# Patient Record
Sex: Female | Born: 1980 | Race: Black or African American | Hispanic: No | Marital: Married | Smoking: Never smoker
Health system: Southern US, Community
[De-identification: ages and names within clinical notes are randomized; demographics above are authoritative.]

## PROBLEM LIST (undated history)

## (undated) DIAGNOSIS — Z9851 Tubal ligation status: Secondary | ICD-10-CM

## (undated) HISTORY — PX: APPENDECTOMY: SHX54

## (undated) HISTORY — PX: ARTHROSCOPIC REPAIR ACL: SUR80

---
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
## 2002-02-22 ENCOUNTER — Other Ambulatory Visit: Admission: RE | Admit: 2002-02-22 | Discharge: 2002-02-22 | Payer: Self-pay | Admitting: Obstetrics & Gynecology

## 2002-02-23 ENCOUNTER — Other Ambulatory Visit: Admission: RE | Admit: 2002-02-23 | Discharge: 2002-02-23 | Payer: Self-pay | Admitting: Obstetrics & Gynecology

## 2002-11-06 ENCOUNTER — Emergency Department (HOSPITAL_COMMUNITY): Admission: EM | Admit: 2002-11-06 | Discharge: 2002-11-07 | Payer: Self-pay | Admitting: Emergency Medicine

## 2002-11-07 ENCOUNTER — Encounter: Payer: Self-pay | Admitting: Emergency Medicine

## 2003-11-13 ENCOUNTER — Inpatient Hospital Stay (HOSPITAL_COMMUNITY): Admission: AD | Admit: 2003-11-13 | Discharge: 2003-11-13 | Payer: Self-pay | Admitting: Obstetrics and Gynecology

## 2004-01-29 ENCOUNTER — Emergency Department (HOSPITAL_COMMUNITY): Admission: EM | Admit: 2004-01-29 | Discharge: 2004-01-29 | Payer: Self-pay | Admitting: Emergency Medicine

## 2004-03-02 ENCOUNTER — Emergency Department (HOSPITAL_COMMUNITY): Admission: EM | Admit: 2004-03-02 | Discharge: 2004-03-03 | Payer: Self-pay | Admitting: Emergency Medicine

## 2010-04-11 ENCOUNTER — Emergency Department (HOSPITAL_COMMUNITY): Admission: EM | Admit: 2010-04-11 | Discharge: 2010-04-11 | Payer: Self-pay | Admitting: Emergency Medicine

## 2010-04-12 ENCOUNTER — Emergency Department (HOSPITAL_COMMUNITY): Admission: EM | Admit: 2010-04-12 | Discharge: 2010-04-12 | Payer: Self-pay | Admitting: Emergency Medicine

## 2010-10-15 LAB — URINALYSIS, ROUTINE W REFLEX MICROSCOPIC
Bilirubin Urine: NEGATIVE
Glucose, UA: NEGATIVE mg/dL
Hgb urine dipstick: NEGATIVE
Ketones, ur: 15 mg/dL — AB
Nitrite: NEGATIVE
Protein, ur: NEGATIVE mg/dL
Specific Gravity, Urine: 1.023 (ref 1.005–1.030)
Urobilinogen, UA: 1 mg/dL (ref 0.0–1.0)
pH: 7.5 (ref 5.0–8.0)

## 2010-10-15 LAB — POCT PREGNANCY, URINE: Preg Test, Ur: NEGATIVE

## 2010-10-15 LAB — URINE MICROSCOPIC-ADD ON

## 2010-10-15 LAB — URINE CULTURE: Culture: NO GROWTH

## 2010-12-18 NOTE — Consult Note (Signed)
[place]  Patient:    Tara Willis, Tara Willis                          MRN: [id number] Proc. Date: 07/05/00 Adm. Date:  [id number] Disc. Date: [id number] Attending:  Doug Sou CC:         Wynona Meals, M.D.   Consultation Report  REASON FOR CONSULT:  Evaluate patient with neck trauma and hoarseness.  HISTORY OF PRESENT ILLNESS:  Tara Willis is a 30 year old black female who was playing basketball earlier tonight with her school team and had an elbow to her neck with resultant hoarseness and loss of voice.  She presents to the emergency room with neck trauma and loss of voice.  PHYSICAL EXAMINATION:  On examination the patient has slightly weak voice but is able to vocalize reasonably well.  She has no airway problems.  She has some slight soreness on swallowing, but again no airway problems.  Palpation exam of the neck reveals no ecchymosis, no swelling.  Fiberoptic laryngoscopy was performed to evaluate the upper airway and larynx.  Base of tongue, epiglottis was normal.  There was no epiglottic swelling.  Vocal cords appeared clear with no significant vocal cord swelling, no ecchymosis on the vocal cords.  Vocal cords had symmetric mobility.  Glottis was clear. Subglottic area likewise was clear.  IMPRESSION:  Blunt laryngeal trauma with normal vocal cord, normal airway exam by laryngoscopy.  RECOMMENDATION:  Recommended voice rest x 24 hours.  Will follow with Dr. Ezzard Standing.  If the patient has any persistent voice problems, patient given the number to the office [phone] for follow-up as needed. DD:  07/05/00 TD:  07/06/00 Job: [id number]

## 2011-04-16 ENCOUNTER — Emergency Department (HOSPITAL_COMMUNITY)
Admission: EM | Admit: 2011-04-16 | Discharge: 2011-04-17 | Disposition: A | Discharge status: Home / Self Care | Attending: Emergency Medicine | Admitting: Emergency Medicine

## 2011-04-16 DIAGNOSIS — K5900 Constipation, unspecified: Secondary | ICD-10-CM | POA: Insufficient documentation

## 2011-04-16 DIAGNOSIS — Z9889 Other specified postprocedural states: Secondary | ICD-10-CM | POA: Insufficient documentation

## 2011-04-16 DIAGNOSIS — Z79899 Other long term (current) drug therapy: Secondary | ICD-10-CM | POA: Insufficient documentation

## 2011-04-17 ENCOUNTER — Emergency Department (HOSPITAL_COMMUNITY)

## 2015-08-03 NOTE — L&D Delivery Note (Signed)
Delivery Note At 8:47 PM a viable and healthy female was delivered via Vaginal, Spontaneous Delivery (Presentation: Left Occiput Anterior).  APGAR: 8, 9; weight pending .   Placenta status: Intact, Spontaneous Pathology due to prematurity.  Cord: CAN x1, not reducible, clamped and cut. 3 vessels with the following complications: None Short.  Cord pH: none  Anesthesia: Epidural  Episiotomy: None Lacerations: None Suture Repair: n/a Est. Blood Loss (mL): 200  Mom to postpartum.  Baby to Couplet care / Skin to Skin.  Afnan Cadiente A 10/20/2015, 10:19 PM

## 2015-10-20 ENCOUNTER — Inpatient Hospital Stay (HOSPITAL_COMMUNITY): Admitting: Anesthesiology

## 2015-10-20 ENCOUNTER — Encounter (HOSPITAL_COMMUNITY): Payer: Self-pay

## 2015-10-20 ENCOUNTER — Inpatient Hospital Stay (HOSPITAL_COMMUNITY)
Admission: AD | Admit: 2015-10-20 | Discharge: 2015-10-22 | DRG: 767 | Disposition: A | Discharge status: Home / Self Care | Source: Ambulatory Visit | Attending: Obstetrics and Gynecology | Admitting: Obstetrics and Gynecology

## 2015-10-20 DIAGNOSIS — Z302 Encounter for sterilization: Secondary | ICD-10-CM | POA: Diagnosis not present

## 2015-10-20 DIAGNOSIS — Z3A35 35 weeks gestation of pregnancy: Secondary | ICD-10-CM | POA: Diagnosis not present

## 2015-10-20 DIAGNOSIS — Z9851 Tubal ligation status: Secondary | ICD-10-CM

## 2015-10-20 HISTORY — DX: Tubal ligation status: Z98.51

## 2015-10-20 LAB — CBC
HCT: 33.5 % — ABNORMAL LOW (ref 36.0–46.0)
Hemoglobin: 11.1 g/dL — ABNORMAL LOW (ref 12.0–15.0)
MCH: 26.1 pg (ref 26.0–34.0)
MCHC: 33.1 g/dL (ref 30.0–36.0)
MCV: 78.6 fL (ref 78.0–100.0)
Platelets: 168 10*3/uL (ref 150–400)
RBC: 4.26 MIL/uL (ref 3.87–5.11)
RDW: 15 % (ref 11.5–15.5)
WBC: 11.2 10*3/uL — ABNORMAL HIGH (ref 4.0–10.5)

## 2015-10-20 LAB — TYPE AND SCREEN
ABO/RH(D): O POS
Antibody Screen: NEGATIVE
DAT, IgG: NEGATIVE

## 2015-10-20 LAB — OB RESULTS CONSOLE GC/CHLAMYDIA
Chlamydia: NEGATIVE
Gonorrhea: NEGATIVE

## 2015-10-20 LAB — OB RESULTS CONSOLE HEPATITIS B SURFACE ANTIGEN: HEP B S AG: NEGATIVE

## 2015-10-20 LAB — OB RESULTS CONSOLE RPR: RPR: NONREACTIVE

## 2015-10-20 LAB — ABO/RH: ABO/RH(D): O POS

## 2015-10-20 LAB — OB RESULTS CONSOLE HIV ANTIBODY (ROUTINE TESTING): HIV: NONREACTIVE

## 2015-10-20 LAB — OB RESULTS CONSOLE ANTIBODY SCREEN: Antibody Screen: NEGATIVE

## 2015-10-20 LAB — OB RESULTS CONSOLE ABO/RH: RH TYPE: POSITIVE

## 2015-10-20 LAB — OB RESULTS CONSOLE RUBELLA ANTIBODY, IGM: RUBELLA: IMMUNE

## 2015-10-20 MED ORDER — OXYTOCIN BOLUS FROM INFUSION: 500.0000 mL | INTRAVENOUS | Status: DC

## 2015-10-20 MED ORDER — PRENATAL MULTIVITAMIN CH
1.0000 | ORAL_TABLET | Freq: Every day | ORAL | Status: DC
  Administered 2015-10-22: 1 via ORAL
  Filled 2015-10-20: qty 1

## 2015-10-20 MED ORDER — SENNOSIDES-DOCUSATE SODIUM 8.6-50 MG PO TABS
2.0000 | ORAL_TABLET | ORAL | Status: DC
  Administered 2015-10-21 (×2): 2 via ORAL
  Filled 2015-10-20: qty 2

## 2015-10-20 MED ORDER — ZOLPIDEM TARTRATE 5 MG PO TABS: 5.0000 mg | ORAL_TABLET | Freq: Every evening | ORAL | Status: DC | PRN

## 2015-10-20 MED ORDER — LIDOCAINE HCL (PF) 1 % IJ SOLN
INTRAMUSCULAR | Status: DC | PRN
  Administered 2015-10-20 (×2): 4 mL via EPIDURAL

## 2015-10-20 MED ORDER — LIDOCAINE HCL (PF) 1 % IJ SOLN
30.0000 mL | INTRAMUSCULAR | Status: DC | PRN
  Filled 2015-10-20: qty 30

## 2015-10-20 MED ORDER — FENTANYL 2.5 MCG/ML BUPIVACAINE 1/10 % EPIDURAL INFUSION (WH - ANES)
14.0000 mL/h | INTRAMUSCULAR | Status: DC | PRN
  Administered 2015-10-20: 14 mL/h via EPIDURAL
  Filled 2015-10-20: qty 125

## 2015-10-20 MED ORDER — NALBUPHINE HCL 10 MG/ML IJ SOLN
10.0000 mg | Freq: Once | INTRAMUSCULAR | Status: DC
  Filled 2015-10-20: qty 1

## 2015-10-20 MED ORDER — DIBUCAINE 1 % RE OINT: 1.0000 "application " | TOPICAL_OINTMENT | RECTAL | Status: DC | PRN

## 2015-10-20 MED ORDER — EPHEDRINE 5 MG/ML INJ
10.0000 mg | INTRAVENOUS | Status: DC | PRN
  Filled 2015-10-20: qty 2

## 2015-10-20 MED ORDER — DEXTROSE IN LACTATED RINGERS 5 % IV SOLN
INTRAVENOUS | Status: DC
  Administered 2015-10-21: 05:00:00 via INTRAVENOUS

## 2015-10-20 MED ORDER — CITRIC ACID-SODIUM CITRATE 334-500 MG/5ML PO SOLN: 30.0000 mL | ORAL | Status: DC | PRN

## 2015-10-20 MED ORDER — ACETAMINOPHEN 325 MG PO TABS: 650.0000 mg | ORAL_TABLET | ORAL | Status: DC | PRN

## 2015-10-20 MED ORDER — SODIUM CHLORIDE 0.9 % IV SOLN
2.0000 g | INTRAVENOUS | Status: AC
  Administered 2015-10-20: 2 g via INTRAVENOUS
  Filled 2015-10-20: qty 2000

## 2015-10-20 MED ORDER — OXYCODONE-ACETAMINOPHEN 5-325 MG PO TABS
1.0000 | ORAL_TABLET | ORAL | Status: DC | PRN
  Administered 2015-10-21 – 2015-10-22 (×5): 1 via ORAL
  Filled 2015-10-20 (×7): qty 1

## 2015-10-20 MED ORDER — ONDANSETRON HCL 4 MG PO TABS: 4.0000 mg | ORAL_TABLET | ORAL | Status: DC | PRN

## 2015-10-20 MED ORDER — OXYCODONE-ACETAMINOPHEN 5-325 MG PO TABS
2.0000 | ORAL_TABLET | ORAL | Status: DC | PRN
  Administered 2015-10-21 – 2015-10-22 (×2): 2 via ORAL
  Filled 2015-10-20: qty 2

## 2015-10-20 MED ORDER — LACTATED RINGERS IV SOLN
INTRAVENOUS | Status: DC
  Administered 2015-10-20: 20:00:00 via INTRAVENOUS

## 2015-10-20 MED ORDER — SIMETHICONE 80 MG PO CHEW: 80.0000 mg | CHEWABLE_TABLET | ORAL | Status: DC | PRN

## 2015-10-20 MED ORDER — LACTATED RINGERS IV SOLN: 500.0000 mL | Freq: Once | INTRAVENOUS | Status: DC

## 2015-10-20 MED ORDER — WITCH HAZEL-GLYCERIN EX PADS: 1.0000 "application " | MEDICATED_PAD | CUTANEOUS | Status: DC | PRN

## 2015-10-20 MED ORDER — DIPHENHYDRAMINE HCL 50 MG/ML IJ SOLN: 12.5000 mg | INTRAMUSCULAR | Status: DC | PRN

## 2015-10-20 MED ORDER — DIPHENHYDRAMINE HCL 25 MG PO CAPS: 25.0000 mg | ORAL_CAPSULE | Freq: Four times a day (QID) | ORAL | Status: DC | PRN

## 2015-10-20 MED ORDER — LACTATED RINGERS IV SOLN
500.0000 mL | INTRAVENOUS | Status: DC | PRN
  Administered 2015-10-20: 500 mL via INTRAVENOUS

## 2015-10-20 MED ORDER — IBUPROFEN 600 MG PO TABS
600.0000 mg | ORAL_TABLET | Freq: Four times a day (QID) | ORAL | Status: DC
  Administered 2015-10-21 – 2015-10-22 (×7): 600 mg via ORAL
  Filled 2015-10-20 (×8): qty 1

## 2015-10-20 MED ORDER — PHENYLEPHRINE 40 MCG/ML (10ML) SYRINGE FOR IV PUSH (FOR BLOOD PRESSURE SUPPORT)
80.0000 ug | PREFILLED_SYRINGE | INTRAVENOUS | Status: DC | PRN
  Filled 2015-10-20: qty 2
  Filled 2015-10-20: qty 20

## 2015-10-20 MED ORDER — BENZOCAINE-MENTHOL 20-0.5 % EX AERO
1.0000 "application " | INHALATION_SPRAY | CUTANEOUS | Status: DC | PRN
  Administered 2015-10-21: 1 via TOPICAL
  Filled 2015-10-20: qty 56

## 2015-10-20 MED ORDER — EPHEDRINE 5 MG/ML INJ: 10.0000 mg | INTRAVENOUS | Status: DC | PRN

## 2015-10-20 MED ORDER — LANOLIN HYDROUS EX OINT: TOPICAL_OINTMENT | CUTANEOUS | Status: DC | PRN

## 2015-10-20 MED ORDER — FERROUS SULFATE 325 (65 FE) MG PO TABS
325.0000 mg | ORAL_TABLET | Freq: Two times a day (BID) | ORAL | Status: DC
  Administered 2015-10-21 – 2015-10-22 (×3): 325 mg via ORAL
  Filled 2015-10-20 (×3): qty 1

## 2015-10-20 MED ORDER — ONDANSETRON HCL 4 MG/2ML IJ SOLN: 4.0000 mg | INTRAMUSCULAR | Status: DC | PRN

## 2015-10-20 MED ORDER — PHENYLEPHRINE 40 MCG/ML (10ML) SYRINGE FOR IV PUSH (FOR BLOOD PRESSURE SUPPORT)
80.0000 ug | PREFILLED_SYRINGE | INTRAVENOUS | Status: DC | PRN
  Filled 2015-10-20: qty 2

## 2015-10-20 MED ORDER — OXYTOCIN 10 UNIT/ML IJ SOLN
2.5000 [IU]/h | INTRAVENOUS | Status: DC
  Administered 2015-10-20: 39.96 [IU]/h via INTRAVENOUS
  Filled 2015-10-20: qty 10

## 2015-10-20 MED ORDER — PHENYLEPHRINE 40 MCG/ML (10ML) SYRINGE FOR IV PUSH (FOR BLOOD PRESSURE SUPPORT): 80.0000 ug | PREFILLED_SYRINGE | INTRAVENOUS | Status: DC | PRN

## 2015-10-20 NOTE — Anesthesia Preprocedure Evaluation (Signed)
Anesthesia Evaluation  Patient identified by MRN, date of birth, ID band Patient awake    Reviewed: Allergy & Precautions, NPO status , Patient's Chart, lab work & pertinent test results  Airway Mallampati: I  TM Distance: >3 FB Neck ROM: Full    Dental  (+) Teeth Intact   Pulmonary neg pulmonary ROS,    breath sounds clear to auscultation       Cardiovascular negative cardio ROS   Rhythm:Regular Rate:Normal     Neuro/Psych negative neurological ROS  negative psych ROS   GI/Hepatic negative GI ROS, Neg liver ROS,   Endo/Other  negative endocrine ROS  Renal/GU negative Renal ROS  negative genitourinary   Musculoskeletal negative musculoskeletal ROS (+)   Abdominal   Peds negative pediatric ROS (+)  Hematology negative hematology ROS (+)   Anesthesia Other Findings   Reproductive/Obstetrics (+) Pregnancy                             Lab Results  Component Value Date   WBC 11.2* 10/20/2015   HGB 11.1* 10/20/2015   HCT 33.5* 10/20/2015   MCV 78.6 10/20/2015   PLT 168 10/20/2015   No results found for: INR, PROTIME   Anesthesia Physical Anesthesia Plan  ASA: II  Anesthesia Plan: Epidural   Post-op Pain Management:    Induction:   Airway Management Planned:   Additional Equipment:   Intra-op Plan:   Post-operative Plan:   Informed Consent: I have reviewed the patients History and Physical, chart, labs and discussed the procedure including the risks, benefits and alternatives for the proposed anesthesia with the patient or authorized representative who has indicated his/her understanding and acceptance.     Plan Discussed with:   Anesthesia Plan Comments:         Anesthesia Quick Evaluation

## 2015-10-20 NOTE — Progress Notes (Addendum)
S: comfortable O: Epidural VE fully/BBOW/-3 Tracing; baseline 130  Ctx q 2-4 mins  IMP: complete IUP @ 35 6/7 weeks Unknown GBS on IV amp P) left exaggerated sims position. Defer amniotomy due to fetal position. Pitocin augmentation prn

## 2015-10-20 NOTE — Anesthesia Procedure Notes (Signed)
Epidural Patient location during procedure: OB Start time: 10/20/2015 5:35 PM End time: 10/20/2015 5:42 PM  Staffing Anesthesiologist: Shona SimpsonHOLLIS, Tara Cotta D Performed by: anesthesiologist   Preanesthetic Checklist Completed: patient identified, site marked, surgical consent, pre-op evaluation, timeout performed, IV checked, risks and benefits discussed and monitors and equipment checked  Epidural Patient position: sitting Prep: ChloraPrep Patient monitoring: heart rate, continuous pulse ox and blood pressure Approach: midline Location: L3-L4 Injection technique: LOR saline  Needle:  Needle type: Tuohy  Needle gauge: 17 G Needle length: 9 cm Catheter type: closed end flexible Catheter size: 20 Guage Test dose: negative and 1.5% lidocaine  Assessment Events: blood not aspirated, injection not painful, no injection resistance and no paresthesia  Additional Notes LOR @ 4.5  Patient identified. Risks/Benefits/Options discussed with patient including but not limited to bleeding, infection, nerve damage, paralysis, failed block, incomplete pain control, headache, blood pressure changes, nausea, vomiting, reactions to medications, itching and postpartum back pain. Confirmed with bedside nurse the patient's most recent platelet count. Confirmed with patient that they are not currently taking any anticoagulation, have any bleeding history or any family history of bleeding disorders. Patient expressed understanding and wished to proceed. All questions were answered. Sterile technique was used throughout the entire procedure. Please see nursing notes for vital signs. Test dose was given through epidural catheter and negative prior to continuing to dose epidural or start infusion. Warning signs of high block given to the patient including shortness of breath, tingling/numbness in hands, complete motor block, or any concerning symptoms with instructions to call for help. Patient was given instructions on  fall risk and not to get out of bed. All questions and concerns addressed with instructions to call with any issues or inadequate analgesia.    Reason for block:procedure for pain

## 2015-10-20 NOTE — Progress Notes (Signed)
Requests permanent sterilization.  Procedure explained.  risk reviewed including failure rate 1/300, nonreversible, permanent, infection, bleeding.  Desires to proceed. Consent signed. sched for 3/21

## 2015-10-20 NOTE — H&P (Signed)
OB ADMISSION/ HISTORY & PHYSICAL:  Admission Date: 10/20/2015  4:28 PM  Admit Diagnosis: labor  Tara Willis is a 35 y.o. female presenting for onset of labor  Prenatal History: EDC : 11/18/2015  [id number] with two PTB [facility]  Primary Ob Provider: Dr Cher[facility]ins [facility] Prenatal course complicated by IDA of pregnancy / hx PTB / hx PEC  Prenatal Labs: ABO, Rh:  O positive Antibody:  negative Rubella:   Immune RPR:   NR HBsAg:   Negative HIV:   NR GTT: NL GBS:   not done yet  Medical / Surgical History :  Past medical history: No past medical history on file.   Past surgical history: No past surgical history on file.  Family History: No family history on file.   Social History:  has no tobacco, alcohol, and drug history on file.  Allergies: NKDA  Review of Systems: Active FM onset of ctx @ 3pm currently every 3 minutes No LOF bloody show present  Physical Exam:  VS: Blood pressure 110/73, pulse 73, resp. rate 18.  General: alert and oriented, appears uncomfortable - requesting pain medication and epidural now Heart: RRR Lungs: Clear lung fields Abdomen: Gravid, soft and non-tender, non-distended / uterus: gravid Extremities: no edema  VE:  7cm / 90% / vtx / BBOW by nurse  FHR: baseline rate 130 / variability moderaet / accelerations + / no decelerations TOCO: ctx Q 3 minutes  Assessment: 36+ weeks gestation active stage of labor FHR category 1   Plan:  Admit Ampicillin prophylaxis for unknown GBS Nubain for pain awaiting epidural CNM to monitor until MD available from office Dr Cherly Hensenousins notified of admission    Marlinda MikeBAILEY, TANYA CNM, MSN, [facility] 10/20/2015, 4:57 PM

## 2015-10-21 ENCOUNTER — Inpatient Hospital Stay (HOSPITAL_COMMUNITY): Admitting: Anesthesiology

## 2015-10-21 ENCOUNTER — Encounter (HOSPITAL_COMMUNITY): Payer: Self-pay | Admitting: *Deleted

## 2015-10-21 ENCOUNTER — Encounter (HOSPITAL_COMMUNITY)
Admission: AD | Disposition: A | Discharge status: Home / Self Care | Payer: Self-pay | Source: Ambulatory Visit | Attending: Obstetrics and Gynecology

## 2015-10-21 DIAGNOSIS — Z9851 Tubal ligation status: Secondary | ICD-10-CM

## 2015-10-21 HISTORY — PX: TUBAL LIGATION: SHX77

## 2015-10-21 HISTORY — DX: Tubal ligation status: Z98.51

## 2015-10-21 LAB — CBC
HEMATOCRIT: 30.5 % — AB (ref 36.0–46.0)
Hemoglobin: 10 g/dL — ABNORMAL LOW (ref 12.0–15.0)
MCH: 25.7 pg — AB (ref 26.0–34.0)
MCHC: 32.8 g/dL (ref 30.0–36.0)
MCV: 78.4 fL (ref 78.0–100.0)
PLATELETS: 129 10*3/uL — AB (ref 150–400)
RBC: 3.89 MIL/uL (ref 3.87–5.11)
RDW: 15.1 % (ref 11.5–15.5)
WBC: 14.6 10*3/uL — ABNORMAL HIGH (ref 4.0–10.5)

## 2015-10-21 LAB — RPR: RPR Ser Ql: NONREACTIVE

## 2015-10-21 SURGERY — LIGATION, FALLOPIAN TUBE, POSTPARTUM
Anesthesia: Epidural | Site: Abdomen | Laterality: Bilateral

## 2015-10-21 MED ORDER — SODIUM BICARBONATE 8.4 % IV SOLN
INTRAVENOUS | Status: DC | PRN
  Administered 2015-10-21 (×5): 2 mL via EPIDURAL
  Administered 2015-10-21 (×2): 5 mL via EPIDURAL

## 2015-10-21 MED ORDER — PNEUMOCOCCAL VAC POLYVALENT 25 MCG/0.5ML IJ INJ
0.5000 mL | INJECTION | INTRAMUSCULAR | Status: DC
  Filled 2015-10-21: qty 0.5

## 2015-10-21 MED ORDER — FAMOTIDINE 20 MG PO TABS
40.0000 mg | ORAL_TABLET | Freq: Once | ORAL | Status: AC
  Administered 2015-10-21: 40 mg via ORAL
  Filled 2015-10-21: qty 2

## 2015-10-21 MED ORDER — FENTANYL CITRATE (PF) 100 MCG/2ML IJ SOLN: 25.0000 ug | INTRAMUSCULAR | Status: DC | PRN

## 2015-10-21 MED ORDER — BUPIVACAINE HCL (PF) 0.25 % IJ SOLN
INTRAMUSCULAR | Status: DC | PRN
  Administered 2015-10-21: 5 mL

## 2015-10-21 MED ORDER — ONDANSETRON HCL 4 MG/2ML IJ SOLN
INTRAMUSCULAR | Status: DC | PRN
  Administered 2015-10-21: 4 mg via INTRAVENOUS

## 2015-10-21 MED ORDER — LACTATED RINGERS IV SOLN
INTRAVENOUS | Status: DC
Start: 2015-10-21 — End: 2015-10-21
  Administered 2015-10-21: 10 mL/h via INTRAVENOUS

## 2015-10-21 MED ORDER — MIDAZOLAM HCL 5 MG/5ML IJ SOLN
INTRAMUSCULAR | Status: DC | PRN
  Administered 2015-10-21 (×2): 0.5 mg via INTRAVENOUS
  Administered 2015-10-21: 1 mg via INTRAVENOUS

## 2015-10-21 MED ORDER — LACTATED RINGERS IV SOLN
INTRAVENOUS | Status: DC | PRN
  Administered 2015-10-21 (×2): via INTRAVENOUS

## 2015-10-21 MED ORDER — METOCLOPRAMIDE HCL 10 MG PO TABS
10.0000 mg | ORAL_TABLET | Freq: Once | ORAL | Status: AC
  Administered 2015-10-21: 10 mg via ORAL
  Filled 2015-10-21: qty 1

## 2015-10-21 SURGICAL SUPPLY — 28 items
CATH ROBINSON RED A/P 16FR (CATHETERS) IMPLANT
CHLORAPREP W/TINT 26ML (MISCELLANEOUS) ×3 IMPLANT
CLOSURE WOUND 1/4 X3 (GAUZE/BANDAGES/DRESSINGS) ×1
CLOTH BEACON ORANGE TIMEOUT ST (SAFETY) ×3 IMPLANT
CONTAINER PREFILL 10% NBF 15ML (MISCELLANEOUS) ×6 IMPLANT
DRSG OPSITE POSTOP 3X4 (GAUZE/BANDAGES/DRESSINGS) ×3 IMPLANT
DRSG OPSITE POSTOP 4X10 (GAUZE/BANDAGES/DRESSINGS) ×3 IMPLANT
ELECT REM PT RETURN 9FT ADLT (ELECTROSURGICAL) ×3
ELECTRODE REM PT RTRN 9FT ADLT (ELECTROSURGICAL) ×1 IMPLANT
GLOVE BIOGEL PI IND STRL 7.0 (GLOVE) ×3 IMPLANT
GLOVE BIOGEL PI INDICATOR 7.0 (GLOVE) ×6
GLOVE ECLIPSE 6.5 STRL STRAW (GLOVE) ×3 IMPLANT
GOWN STRL REUS W/TWL LRG LVL3 (GOWN DISPOSABLE) ×6 IMPLANT
NEEDLE HYPO 22GX1.5 SAFETY (NEEDLE) ×3 IMPLANT
NS IRRIG 1000ML POUR BTL (IV SOLUTION) ×3 IMPLANT
PACK ABDOMINAL MINOR (CUSTOM PROCEDURE TRAY) ×3 IMPLANT
PENCIL BUTTON HOLSTER BLD 10FT (ELECTRODE) ×3 IMPLANT
SLEEVE SCD COMPRESS KNEE MED (MISCELLANEOUS) ×3 IMPLANT
SPONGE LAP 4X18 X RAY DECT (DISPOSABLE) IMPLANT
STRIP CLOSURE SKIN 1/4X3 (GAUZE/BANDAGES/DRESSINGS) ×2 IMPLANT
SUT CHROMIC GUT AB #0 18 (SUTURE) ×3 IMPLANT
SUT VIC AB 0 CT1 27 (SUTURE) ×2
SUT VIC AB 0 CT1 27XBRD ANBCTR (SUTURE) ×1 IMPLANT
SUT VICRYL 4-0 PS2 18IN ABS (SUTURE) ×3 IMPLANT
SYR CONTROL 10ML LL (SYRINGE) ×3 IMPLANT
TOWEL OR 17X24 6PK STRL BLUE (TOWEL DISPOSABLE) ×6 IMPLANT
TRAY FOLEY CATH SILVER 14FR (SET/KITS/TRAYS/PACK) ×3 IMPLANT
WATER STERILE IRR 1000ML POUR (IV SOLUTION) ×3 IMPLANT

## 2015-10-21 NOTE — Anesthesia Preprocedure Evaluation (Signed)
Anesthesia Evaluation  Patient identified by MRN, date of birth, ID band Patient awake    Reviewed: Allergy & Precautions, H&P , NPO status , Patient's Chart, lab work & pertinent test results  Airway Mallampati: I  TM Distance: >3 FB Neck ROM: Full    Dental no notable dental hx. (+) Teeth Intact   Pulmonary neg pulmonary ROS,    Pulmonary exam normal breath sounds clear to auscultation       Cardiovascular Exercise Tolerance: Good negative cardio ROS   Rhythm:Regular Rate:Normal     Neuro/Psych negative neurological ROS  negative psych ROS   GI/Hepatic negative GI ROS, Neg liver ROS,   Endo/Other  negative endocrine ROS  Renal/GU negative Renal ROS  negative genitourinary   Musculoskeletal negative musculoskeletal ROS (+)   Abdominal   Peds negative pediatric ROS (+)  Hematology negative hematology ROS (+)   Anesthesia Other Findings   Reproductive/Obstetrics (+) Pregnancy                             Lab Results  Component Value Date   WBC 14.6* 10/21/2015   HGB 10.0* 10/21/2015   HCT 30.5* 10/21/2015   MCV 78.4 10/21/2015   PLT 129* 10/21/2015   No results found for: INR, PROTIME   Anesthesia Physical  Anesthesia Plan  ASA: II  Anesthesia Plan: Epidural   Post-op Pain Management:    Induction:   Airway Management Planned:   Additional Equipment:   Intra-op Plan:   Post-operative Plan:   Informed Consent: I have reviewed the patients History and Physical, chart, labs and discussed the procedure including the risks, benefits and alternatives for the proposed anesthesia with the patient or authorized representative who has indicated his/her understanding and acceptance.     Plan Discussed with:   Anesthesia Plan Comments:         Anesthesia Quick Evaluation

## 2015-10-21 NOTE — Anesthesia Postprocedure Evaluation (Signed)
Anesthesia Post Note  Patient: Tara Willis  Procedure(s) Performed: * No procedures listed *  Patient location during evaluation: Mother Baby Anesthesia Type: Epidural Level of consciousness: awake and alert Pain management: pain level controlled Vital Signs Assessment: post-procedure vital signs reviewed and stable Respiratory status: spontaneous breathing Cardiovascular status: stable Postop Assessment: no headache, no backache, epidural receding and patient able to bend at knees Anesthetic complications: no    Last Vitals:  Filed Vitals:   10/21/15 0001 10/21/15 0400  BP: 111/80 123/81  Pulse: 70 65  Temp: 36.4 C 37 C  Resp: 18 18    Last Pain:  Filed Vitals:   10/21/15 0423  PainSc: 0-No pain                 Edison PaceWILKERSON,Sabino Denning

## 2015-10-21 NOTE — Transfer of Care (Signed)
Immediate Anesthesia Transfer of Care Note  Patient: Tara Willis  Procedure(s) Performed: Procedure(s): POST PARTUM TUBAL LIGATION Modified Pomeroy  (Bilateral)  Patient Location: PACU  Anesthesia Type:Epidural  Level of Consciousness: awake  Airway & Oxygen Therapy: Patient Spontanous Breathing  Post-op Assessment: Report given to RN and Post -op Vital signs reviewed and stable  Post vital signs: stable  Last Vitals:  Filed Vitals:   10/21/15 1323 10/21/15 1324  BP:    Pulse: 74 71  Temp:    Resp:      Complications: No apparent anesthesia complications

## 2015-10-21 NOTE — Anesthesia Postprocedure Evaluation (Signed)
Anesthesia Post Note  Patient: Diella N Boliver  Procedure(s) Performed: Procedure(s) (LRB): POST PARTUM TUBAL LIGATION Modified Pomeroy  (Bilateral)  Patient location during evaluation: PACU Anesthesia Type: General Level of consciousness: sedated Pain management: satisfactory to patient Vital Signs Assessment: post-procedure vital signs reviewed and stable Respiratory status: spontaneous breathing Cardiovascular status: stable Anesthetic complications: no    Last Vitals:  Filed Vitals:   10/21/15 1515 10/21/15 1543  BP:    Pulse: 69 76  Temp:  36.8 C  Resp: 15     Last Pain:  Filed Vitals:   10/21/15 1545  PainSc: 2     LLE Motor Response: Purposeful movement (rocks hips) (10/21/15 1543) LLE Sensation: Decreased;Tingling (10/21/15 1543) RLE Motor Response: Purposeful movement (10/21/15 1543) RLE Sensation: Decreased;Tingling (10/21/15 1543)      Jiles GarterJACKSON,Elizah Lydon EDWARD

## 2015-10-21 NOTE — Brief Op Note (Signed)
10/20/2015 - 10/21/2015  2:41 PM  PATIENT:  Tara Willis  35 y.o. female  PRE-OPERATIVE DIAGNOSIS:  Desires Sterilization  POST-OPERATIVE DIAGNOSIS:  Desires Sterilization  PROCEDURE:  Procedure(s): POST PARTUM TUBAL LIGATION Modified Pomeroy  (Bilateral)  SURGEON:  Surgeon(s) and Role:    * IT trainerheronette Aadith Raudenbush, MD - Primary  PHYSICIAN ASSISTANT:   ASSISTANTS: none   ANESTHESIA:   epidural Findings: nl tubes and ovaries, uterus 1-2 FB above umb.  umb hernia EBL:  Total I/O In: 1908.3 [I.V.:1908.3] Out: 330 [Urine:325; Blood:5]  BLOOD ADMINISTERED:none  DRAINS: none   LOCAL MEDICATIONS USED:  MARCAINE     SPECIMEN:  Source of Specimen:  portion of right and left tube  DISPOSITION OF SPECIMEN:  PATHOLOGY  COUNTS:  YES  TOURNIQUET:  * No tourniquets in log *  DICTATION: .Other Dictation: Dictation Number [id number]  PLAN OF CARE: Admit to inpatient   PATIENT DISPOSITION:  PACU - hemodynamically stable.   Delay start of Pharmacological VTE agent (>24hrs) due to surgical blood loss or risk of bleeding: no

## 2015-10-21 NOTE — Progress Notes (Signed)
Patient ID: Tara Willis, female   DOB: 07/05/1981, 35 y.o.   MRN: [id number] PPD # 1 SVD  S:  Reports feeling well.             Tolerating po/ No nausea or vomiting             Bleeding is light             Pain controlled with ibuprofen (OTC)             Up ad lib / ambulatory / voiding without difficulties    Newborn  Information for the patient's newborn:  [name], Girl [name] [[id number]][[id number]]  female  bottle feeding     O:  A & O x 3, in no apparent distress              VS:  Filed Vitals:   10/20/15 2201 10/20/15 2305 10/21/15 0001 10/21/15 0400  BP: 118/84 113/69 111/80 123/81  Pulse: 71 69 70 65  Temp:  97.6 F (36.4 C) 97.5 F (36.4 C) 98.6 F (37 C)  TempSrc:  Oral Oral Oral  Resp: 18 18 18 18   Height:      Weight:        LABS:  Recent Labs  10/20/15 1655 10/21/15 0753  WBC 11.2* 14.6*  HGB 11.1* 10.0*  HCT 33.5* 30.5*  PLT 168 129*    Blood type: O/Positive/-- (03/20 1724)  Rubella: Immune (03/20 1724)   I&O: I/O last 3 completed shifts: In: -  Out: 250 [Urine:50; Blood:200]             Lungs: Clear and unlabored  Heart: regular rate and rhythm / no murmurs  Abdomen: soft, non-tender, non-distended             Fundus: firm, non-tender, U-1  Perineum: intact, no edema  Lochia: minimal  Extremities: No edema, no calf pain or tenderness, No Homans    A/P: PPD # 1  35 y.o., [id number]   Principal Problem:    Postpartum care following vaginal delivery (3/20)  Active Problems:    Normal labor and delivery    Request for sterilization   Doing well - stable status  Routine post partum orders  BTL scheduled today for 1330  Anticipate discharge tomorrow    Raelyn MoraAWSON, Lyon Dumont, M, MSN, CNM 10/21/2015, 9:24 AM

## 2015-10-22 ENCOUNTER — Encounter (HOSPITAL_COMMUNITY): Payer: Self-pay | Admitting: Obstetrics and Gynecology

## 2015-10-22 MED ORDER — IBUPROFEN 800 MG PO TABS: 800.0000 mg | ORAL_TABLET | Freq: Four times a day (QID) | ORAL | Status: DC

## 2015-10-22 MED ORDER — OXYCODONE-ACETAMINOPHEN 5-325 MG PO TABS: 1.0000 | ORAL_TABLET | ORAL | Status: DC | PRN

## 2015-10-22 NOTE — Op Note (Signed)
NAME:  Tara Willis, Tara Willis                  ACCOUNT NO.:  [id number]  MEDICAL RECORD NO.:  [id number]  LOCATION:  9132                          FACILITY:  [facility]  PHYSICIAN:  Maxie BetterSheronette Johaan Ryser, M.D.DATE OF BIRTH:  [birth year]  DATE OF PROCEDURE:  10/21/2015 DATE OF DISCHARGE:                              OPERATIVE REPORT   PREOPERATIVE DIAGNOSIS:  Desires sterilization, status post vaginal delivery  PROCEDURE:  Modified Pomeroy postpartum tubal ligation.  POSTOPERATIVE DIAGNOSIS:  Desires sterilization, status post vaginal delivery.  ANESTHESIA:  Epidural.  SURGEON:  Maxie BetterSheronette Crystalyn Delia, M.D.  ASSISTANT:  None.  DESCRIPTION OF PROCEDURE:  Under adequate epidural anesthesia, the patient was placed in the supine position.  She was sterilely prepped and draped in usual fashion.  Indwelling Foley catheter was sterilely placed.  The exam was notable for uterus that was 1-2 fingerbreadths above the umbilicus.  The umbilicus had a small umbilical hernia which was not incarcerated.  Allis clamps used to outlined at the inferior border of the umbilicus.  0.25% Marcaine was injected along the planned incision site. Infraumbilical incision was then made, carried down to the rectus fascia. The parietal peritoneum was opened and extended.  At that point, the uterus was noted to be dextrorotated. The left tube was identified down to its fimbriated end.  The left ovary was exteriorized as was the tube.  The midportion of the fallopian tube was grasped with a Babcock.  The underlying mesosalpinx opened with cautery.  The proximal and distal portion of tube was tied with 0 chromic sutures x2 proximally and distally and intervening segment of tube was then removed, and the ovary and tube were then placed back in the abdomen.  On the opposite side, after rotating the patient to the left, the right fallopian tube was identified down to its fimbriated end and the ovary was also exteriorized and noted to  be normal.  The mid portion of that tube was also grasped with a Babcock. The underlying mesosalpinx was opened with cautery.  The proximal and distal portion of the tube was tied with 0 chromic suture x2 and the intervening segment of tube was then removed.  The tube and ovary were then placed in the abdomen.  The parietal peritoneum was identified and closed with 3-0 Vicryl suture.  The fascia was then identified and closed with a running stitch of 0 Vicryl suture and a 4-0 Vicryl was used to subcuticular skin closure.  SPECIMENS:  Mid portion of right and left fallopian tubes sent to Pathology.  ESTIMATED BLOOD LOSS:  5 mL.  COMPLICATION:  None.  The patient tolerated the procedure well and was transferred to recovery room in stable condition.     Maxie BetterSheronette Tavaris Eudy, M.D.     [place]/MEDQ  D:  10/21/2015  T:  10/22/2015  Job:  [id number]

## 2015-10-22 NOTE — Discharge Summary (Signed)
Obstetric Discharge Summary \ Reason for Admission: onset of labor, preterm labor( unstoppable) Prenatal Procedures: none Intrapartum Procedures: spontaneous vaginal delivery Postpartum Procedures: P.P. tubal ligation Complications-Operative and Postpartum: none HEMOGLOBIN  Date Value Ref Range Status  10/21/2015 10.0* 12.0 - 15.0 g/dL Final   HCT  Date Value Ref Range Status  10/21/2015 30.5* 36.0 - 46.0 % Final    Physical Exam:  General: alert, cooperative and no distress Lochia: appropriate Uterine Fundus: firm Incision: healing well DVT Evaluation: No evidence of DVT seen on physical exam.  Discharge Diagnoses: Premature labor and PTB @ 35 6/7 weeks, desired sterilization with Bilateral tubal sterilization  Discharge Information: Date: 10/22/2015 Activity: pelvic rest Diet: routine Medications: PNV, Ibuprofen and Percocet Condition: stable Instructions: refer to practice specific booklet Discharge to: home Follow-up Information    Follow up with Kadeem Hyle A, MD. Schedule an appointment as soon as possible for a visit in 6 weeks.   Specialty:  Obstetrics and Gynecology   Contact information:   [address] [place] [address] [phone]       Newborn Data: Live born female  Birth Weight: 5 lb 4.1 oz (2385 g) APGAR: 8, 9  Home with mother.  Marlinda MikeBAILEY, TANYA 10/22/2015, 4:55 PM

## 2015-10-22 NOTE — Progress Notes (Signed)
PPD 2 SVD / POD 1 s/p BTL  S:  Reports feeling well             Tolerating po/ No nausea or vomiting             Bleeding is light             Pain minimally controlled withmotrin and percocet             Up ad lib / ambulatory / voiding QS  Newborn breast and formula feeding   O:               VS: BP 119/80 mmHg  Pulse 58  Temp(Src) 98.2 F (36.8 C) (Oral)  Resp 18  Ht 5' (1.524 m)  Wt 55.792 kg (123 lb)  BMI 24.02 kg/m2  SpO2 100%  Breastfeeding? Unknown   LABS:              Recent Labs  10/20/15 1655 10/21/15 0753  WBC 11.2* 14.6*  HGB 11.1* 10.0*  PLT 168 129*               Blood type: O/Positive/-- (03/20 1724)  Rubella: Immune (03/20 1724)                     I&O: Intake/Output      03/21 0701 - 03/22 0700 03/22 0701 - 03/23 0700   I.V. (mL/kg) 2208.3 (39.6)    Total Intake(mL/kg) 2208.3 (39.6)    Urine (mL/kg/hr) 525 (0.4)    Blood 5 (0)    Total Output 530     Net +1678.3          Urine Occurrence 1 x                  Physical Exam:             Alert and oriented X3  Abdomen: soft, non-tender, non-distended, umbilical bandage: D/C/I             Fundus: firm, non-tender, U-2  Perineum: no edema  Lochia: light  Extremities: no edema, no calf pain or tenderness    A: PPD # 2   Doing well - stable status  P: Routine post partum orders  Dc home - room in with newborn until DC  Tara MikeBAILEY, Tara Willis CNM, MSN, [facility] 10/22/2015, 4:52 PM

## 2015-10-22 NOTE — Anesthesia Postprocedure Evaluation (Signed)
Anesthesia Post Note  Patient: Tara Willis  Procedure(s) Performed: Procedure(s) (LRB): POST PARTUM TUBAL LIGATION Modified Pomeroy  (Bilateral)  Patient location during evaluation: Mother Baby Anesthesia Type: Epidural Level of consciousness: awake and alert Pain management: pain level controlled Vital Signs Assessment: post-procedure vital signs reviewed and stable Respiratory status: spontaneous breathing, nonlabored ventilation and respiratory function stable Cardiovascular status: stable Postop Assessment: no headache, no backache and epidural receding Anesthetic complications: no    Last Vitals:  Filed Vitals:   10/22/15 0212 10/22/15 0552  BP: 111/73 119/80  Pulse: 69 58  Temp: 36.9 C 36.8 C  Resp: 16 18    Last Pain:  Filed Vitals:   10/22/15 0739  PainSc: Asleep                 Itzamar Traynor

## 2015-10-22 NOTE — Addendum Note (Signed)
Addendum  created 10/22/15 0749 by Junious SilkMelinda Wilna Pennie, CRNA   Modules edited: Clinical Notes   Clinical Notes:  File: [id number]

## 2015-10-23 ENCOUNTER — Ambulatory Visit: Payer: Self-pay

## 2015-10-23 NOTE — Lactation Note (Signed)
This note was copied from a baby's chart. Lactation Consultation Note  Patient Name: Tara Farrel Demarkris Hefel AVWUJ'WToday's Date: 10/23/2015 Reason for consult: Initial assessment;Infant < 6lbs;Late preterm infant   Initial Consult with Exp BF mom of 7359 hour old infant born at 6335 w 6 d gestation. Mom reports her older 2 children were born at 5236 weeks and both BF for 2 months.  Infant with 6 BF for 10-30 minutes, 6 bottles of formula of 5-50 cc, 1 supplement of EBM of 25 cc via bottle, 4 voids and 3 stools in last 24 hours. Infant weight 5 lb 1.7 oz with 3% weight loss since birth. LATCH Scores 10 by bedside RN.   Mom is pumping, she says not regularly. She has received 25 and 30 cc with pumping this morning. She reports her breast soften with pumping and when infant breastfeeding. Enc mom to pump post BF for 15 minutes on maintenance setting, advised mom to change to maintenance setting. Mom reports she is turning suction on pump up high and is getting sore, enc her to decrease suction to tolerable level. Mom has a PIS at home for use.   Reviewed Late Preterm Infant policy with mom. Advised mom that infant needs to be fed at least every 3 hours. Enc her to BF first, followed by supplementation, then pumping and hand expression. Mom voiced understanding.   Left my phone # and asked mom to call for next feeding.   LB Brochure given, informed mom of LC phone#, BF Support Groups, and OP Services. BF Resources Handout given. [facility], [place]. Gave mom feeding Log and enc her to maintain and take to Ped appt. Infant has a Ped appt Saturday.       Maternal Data Formula Feeding for Exclusion: No Does the patient have breastfeeding experience prior to this delivery?: Yes  Feeding Feeding Type: Breast Milk Nipple Type: Slow - flow  LATCH Score/Interventions                      Lactation Tools  Discussed/Used WIC Program:  (Plans to apply) Pump Review: Setup, frequency, and cleaning;Milk Storage   Consult Status Consult Status: Follow-up Date: 10/23/15 Follow-up type: In-patient    Silas FloodSharon S Harly Pipkins 10/23/2015, 8:50 AM

## 2015-10-23 NOTE — Lactation Note (Signed)
This note was copied from a baby's chart. Lactation Consultation Note  Patient Name: Tara Willis JXBJY'NToday's Date: 10/23/2015 Reason for consult: Follow-up assessment;Infant < 6lbs;Late preterm infant   Mom called me for feeding assessment. Mom had infant latched in a cradle hold, infant was rhythmically sucking and was noted to have a very large space between breast and infant nose. Reviewed with mom Cross Cradle hold to pull infant in deeper. Mom denies pain with latch. Infant noted to have intermittent swallows that increase with breast compression. Enc mom to keep infant awake with feeding and to offer supplement post BF. Reviewed Late preterm infant policy/handout with mom and reviewed supplementation amounts with mom.   Reviewed all BF information in Taking Care of Baby and Me Booklet. Reviewed Engorgement prevention/treatment with mom . She has a DEBP at home for use.   MD is unsure if infant will be d/c home today. Monitoring weights. Mom is aware why infant may need to stay longer.   Enc mom to feed infant at least every 3 hours followed by supplementation, pumping and hand expressions. Mom voiced understanding.    Enc mom to call with questions/concerns prn   Maternal Data Formula Feeding for Exclusion: No Does the patient have breastfeeding experience prior to this delivery?: Yes  Feeding Feeding Type: Breast Fed Length of feed: 20 min  LATCH Score/Interventions Latch: Grasps breast easily, tongue down, lips flanged, rhythmical sucking.  Audible Swallowing: Spontaneous and intermittent Intervention(s): Alternate breast massage;Hand expression  Type of Nipple: Everted at rest and after stimulation  Comfort (Breast/Nipple): Filling, red/small blisters or bruises, mild/mod discomfort  Problem noted: Mild/Moderate discomfort  Hold (Positioning): Assistance needed to correctly position infant at breast and maintain latch. Intervention(s): Breastfeeding basics  reviewed;Support Pillows;Position options;Skin to skin  LATCH Score: 8  Lactation Tools Discussed/Used WIC Program:  (Plans to apply) Pump Review: Setup, frequency, and cleaning;Milk Storage   Consult Status Consult Status: Follow-up Date: 10/24/15 Follow-up type: In-patient    Tara FloodSharon S Willis 10/23/2015, 10:35 AM

## 2016-08-07 ENCOUNTER — Ambulatory Visit (HOSPITAL_COMMUNITY)
Admission: EM | Admit: 2016-08-07 | Discharge: 2016-08-07 | Disposition: A | Discharge status: Home / Self Care | Attending: Family Medicine | Admitting: Family Medicine

## 2016-08-07 ENCOUNTER — Encounter (HOSPITAL_COMMUNITY): Payer: Self-pay | Admitting: Emergency Medicine

## 2016-08-07 DIAGNOSIS — R05 Cough: Secondary | ICD-10-CM

## 2016-08-07 DIAGNOSIS — R69 Illness, unspecified: Secondary | ICD-10-CM

## 2016-08-07 DIAGNOSIS — J111 Influenza due to unidentified influenza virus with other respiratory manifestations: Secondary | ICD-10-CM

## 2016-08-07 DIAGNOSIS — J069 Acute upper respiratory infection, unspecified: Secondary | ICD-10-CM

## 2016-08-07 DIAGNOSIS — R059 Cough, unspecified: Secondary | ICD-10-CM

## 2016-08-07 MED ORDER — AZITHROMYCIN 250 MG PO TABS: 250.0000 mg | ORAL_TABLET | Freq: Every day | ORAL | 0 refills | Status: DC

## 2016-08-07 MED ORDER — OSELTAMIVIR PHOSPHATE 75 MG PO CAPS: 75.0000 mg | ORAL_CAPSULE | Freq: Two times a day (BID) | ORAL | 0 refills | Status: DC

## 2016-08-07 MED ORDER — BENZONATATE 100 MG PO CAPS: 100.0000 mg | ORAL_CAPSULE | Freq: Three times a day (TID) | ORAL | 0 refills | Status: DC

## 2016-08-07 NOTE — ED Triage Notes (Signed)
Headache, bilateral ear pain, vomiting, fever of 101.7.  symptoms

## 2016-08-07 NOTE — ED Provider Notes (Signed)
CSN: [id number]     Arrival date & time 08/07/16  1259 History   None    Chief Complaint  Patient presents with  . URI   (Consider location/radiation/quality/duration/timing/severity/associated sxs/prior Treatment) Patient c/o fever x 2 days associated with cough and bilateral ear pain.   The history is provided by the patient.  URI  Presenting symptoms: congestion, cough, ear pain, fatigue and fever   Severity:  Moderate Onset quality:  Sudden Duration:  2 days Timing:  Constant Progression:  Unchanged Chronicity:  New Relieved by:  OTC medications Worsened by:  Nothing Ineffective treatments:  None tried Associated symptoms: arthralgias and myalgias     Past Medical History:  Diagnosis Date  . Postpartum care following vaginal delivery (3/20) 10/21/2015  . S/P bilateral tubal ligation (3/21) 10/21/2015   Past Surgical History:  Procedure Laterality Date  . APPENDECTOMY    . ARTHROSCOPIC REPAIR ACL    . TUBAL LIGATION Bilateral 10/21/2015   Procedure: POST PARTUM TUBAL LIGATION Modified [place] ;  Surgeon: Maxie Better, MD;  Location: [facility];  Service: Gynecology;  Laterality: Bilateral;   No family history on file. Social History  Substance Use Topics  . Smoking status: Never Smoker  . Smokeless tobacco: Not on file  . Alcohol use No   OB History    Gravida Para Term Preterm AB Living   4 3 0 3 1 3    SAB TAB Ectopic Multiple Live Births     1   0 3     Review of Systems  Constitutional: Positive for fatigue and fever.  HENT: Positive for congestion and ear pain.   Eyes: Negative.   Respiratory: Positive for cough.   Cardiovascular: Negative.   Gastrointestinal: Negative.   Endocrine: Negative.   Musculoskeletal: Positive for arthralgias and myalgias.  Allergic/Immunologic: Negative.   Neurological: Negative.   Hematological: Negative.   Psychiatric/Behavioral: Negative.     Allergies  Poultry meal; Sesame oil; and Eggs or egg-derived  products  Home Medications   Prior to Admission medications   Medication Sig Start Date End Date Taking? Authorizing Provider  Phenylephrine-DM-GG (TUSSIN CF COUGH & COLD PO) Take by mouth.   Yes Historical Provider, MD  sodium-potassium bicarbonate (ALKA-SELTZER GOLD) TBEF dissolvable tablet Take 1 tablet by mouth daily as needed.   Yes Historical Provider, MD  azithromycin (ZITHROMAX) 250 MG tablet Take 1 tablet (250 mg total) by mouth daily. Take first 2 tablets together, then 1 every day until finished. 08/07/16   Deatra Canter, FNP  benzonatate (TESSALON) 100 MG capsule Take 1 capsule (100 mg total) by mouth every 8 (eight) hours. 08/07/16   Deatra Canter, FNP  ibuprofen (ADVIL,MOTRIN) 800 MG tablet Take 1 tablet (800 mg total) by mouth every 6 (six) hours. 10/22/15   Marlinda Mike, CNM  oseltamivir (TAMIFLU) 75 MG capsule Take 1 capsule (75 mg total) by mouth every 12 (twelve) hours. 08/07/16   Deatra Canter, FNP  oxyCODONE-acetaminophen (PERCOCET/ROXICET) 5-325 MG tablet Take 1 tablet by mouth every 4 (four) hours as needed (for pain scale greater than or equal to 4 and less than 7). 10/22/15   Marlinda Mike, CNM  Prenatal Vit-Fe Fumarate-FA (PRENATAL MULTIVITAMIN) TABS tablet Take 1 tablet by mouth daily at 12 noon.    Historical Provider, MD   Meds Ordered and Administered this Visit  Medications - No data to display  BP 105/69   Pulse 94   Temp 99.4 F (37.4 C) (Oral)   Resp 22  LMP 07/29/2016   SpO2 99%  No data found.   Physical Exam  Constitutional: She appears well-developed and well-nourished.  HENT:  Head: Normocephalic and atraumatic.  Right Ear: External ear normal.  Left Ear: External ear normal.  Mouth/Throat: Oropharynx is clear and moist.  Eyes: Conjunctivae and EOM are normal. Pupils are equal, round, and reactive to light.  Neck: Normal range of motion. Neck supple.  Cardiovascular: Normal rate, regular rhythm and normal heart sounds.   Pulmonary/Chest:  Effort normal and breath sounds normal.  Abdominal: Soft. Bowel sounds are normal.  Nursing note and vitals reviewed.   Urgent Care Course   Clinical Course     Procedures (including critical care time)  Labs Review Labs Reviewed - No data to display  Imaging Review No results found.   Visual Acuity Review  Right Eye Distance:   Left Eye Distance:   Bilateral Distance:    Right Eye Near:   Left Eye Near:    Bilateral Near:         MDM   1. Influenza-like illness   2. Upper respiratory tract infection, unspecified type   3. Cough    Zpak Tamiflu 75mg  one po bid x 5 days #10 Tessalon perles  Push po fluids, rest, tylenol and motrin otc prn as directed for fever, arthralgias, and myalgias.  Follow up prn if sx's continue or persist.    Deatra CanterWilliam J Joleah Kosak, FNP 08/07/16 1440

## 2017-08-31 DIAGNOSIS — Z91018 Allergy to other foods: Secondary | ICD-10-CM | POA: Insufficient documentation

## 2018-02-06 ENCOUNTER — Other Ambulatory Visit: Payer: Self-pay

## 2018-02-06 ENCOUNTER — Emergency Department (HOSPITAL_COMMUNITY)

## 2018-02-06 ENCOUNTER — Encounter (HOSPITAL_COMMUNITY): Payer: Self-pay

## 2018-02-06 ENCOUNTER — Emergency Department (HOSPITAL_COMMUNITY)
Admission: EM | Admit: 2018-02-06 | Discharge: 2018-02-06 | Disposition: A | Discharge status: Home / Self Care | Attending: Emergency Medicine | Admitting: Emergency Medicine

## 2018-02-06 DIAGNOSIS — M4300 Spondylolysis, site unspecified: Secondary | ICD-10-CM | POA: Diagnosis not present

## 2018-02-06 DIAGNOSIS — Z79899 Other long term (current) drug therapy: Secondary | ICD-10-CM | POA: Insufficient documentation

## 2018-02-06 DIAGNOSIS — M25561 Pain in right knee: Secondary | ICD-10-CM | POA: Insufficient documentation

## 2018-02-06 LAB — POC URINE PREG, ED: Preg Test, Ur: NEGATIVE

## 2018-02-06 MED ORDER — IBUPROFEN 600 MG PO TABS: 600.0000 mg | ORAL_TABLET | Freq: Four times a day (QID) | ORAL | 0 refills | Status: AC | PRN

## 2018-02-06 MED ORDER — CYCLOBENZAPRINE HCL 5 MG PO TABS: 5.0000 mg | ORAL_TABLET | Freq: Two times a day (BID) | ORAL | 0 refills | Status: DC | PRN

## 2018-02-06 MED ORDER — OXYCODONE-ACETAMINOPHEN 5-325 MG PO TABS
1.0000 | ORAL_TABLET | Freq: Once | ORAL | Status: AC
  Administered 2018-02-06: 1 via ORAL
  Filled 2018-02-06: qty 1

## 2018-02-06 NOTE — ED Notes (Signed)
Patient transported to CT 

## 2018-02-06 NOTE — ED Notes (Signed)
Ortho at bedside.
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
## 2018-02-06 NOTE — ED Notes (Signed)
ED Provider at bedside. 

## 2018-02-06 NOTE — Progress Notes (Signed)
Orthopedic Tech Progress Note Patient Details:  Marthe Patchris N Jamil [birth year] [id number]  Ortho Devices Type of Ortho Device: Crutches, Knee Immobilizer Ortho Device/Splint Location: rle Ortho Device/Splint Interventions: Ordered, Application, Adjustment   Post Interventions Patient Tolerated: Well Instructions Provided: Care of device, Adjustment of device   Trinna PostMartinez, Darshawn Boateng J 02/06/2018, 9:45 PM
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
## 2018-02-17 DIAGNOSIS — S39012A Strain of muscle, fascia and tendon of lower back, initial encounter: Secondary | ICD-10-CM | POA: Insufficient documentation

## 2018-02-17 DIAGNOSIS — M7041 Prepatellar bursitis, right knee: Secondary | ICD-10-CM | POA: Insufficient documentation

## 2018-03-07 ENCOUNTER — Encounter (HOSPITAL_COMMUNITY): Payer: Self-pay

## 2018-03-07 ENCOUNTER — Emergency Department (HOSPITAL_COMMUNITY)
Admission: EM | Admit: 2018-03-07 | Discharge: 2018-03-07 | Disposition: A | Discharge status: Home / Self Care | Attending: Emergency Medicine | Admitting: Emergency Medicine

## 2018-03-07 ENCOUNTER — Other Ambulatory Visit: Payer: Self-pay

## 2018-03-07 DIAGNOSIS — M545 Low back pain: Secondary | ICD-10-CM | POA: Diagnosis present

## 2018-03-07 DIAGNOSIS — Z79899 Other long term (current) drug therapy: Secondary | ICD-10-CM | POA: Insufficient documentation

## 2018-03-07 DIAGNOSIS — M5441 Lumbago with sciatica, right side: Secondary | ICD-10-CM | POA: Diagnosis not present

## 2018-03-07 LAB — URINALYSIS, ROUTINE W REFLEX MICROSCOPIC
BILIRUBIN URINE: NEGATIVE
Glucose, UA: NEGATIVE mg/dL
KETONES UR: NEGATIVE mg/dL
Nitrite: NEGATIVE
PROTEIN: 100 mg/dL — AB
Specific Gravity, Urine: 1.013 (ref 1.005–1.030)
pH: 7 (ref 5.0–8.0)

## 2018-03-07 MED ORDER — KETOROLAC TROMETHAMINE 15 MG/ML IJ SOLN
15.0000 mg | Freq: Once | INTRAMUSCULAR | Status: AC
  Administered 2018-03-07: 15 mg via INTRAMUSCULAR
  Filled 2018-03-07: qty 1

## 2018-03-07 MED ORDER — OXYCODONE-ACETAMINOPHEN 5-325 MG PO TABS: 1.0000 | ORAL_TABLET | Freq: Two times a day (BID) | ORAL | 0 refills | Status: AC

## 2018-03-07 NOTE — Discharge Instructions (Signed)
Please follow up with PCP and orthopedics for pain control.If you experience any bowel or bladder incontinence please return to the ED
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
## 2018-10-27 DIAGNOSIS — G4700 Insomnia, unspecified: Secondary | ICD-10-CM | POA: Insufficient documentation

## 2019-04-20 IMAGING — DX DG LUMBAR SPINE COMPLETE 4+V
5 series · 5 of 5 positions shown · non-contrast
Comparison: 04/12/2010 MRI of the lumbar spine.

CLINICAL DATA: 36 y/o F; lower back pain, right knee pain, right
leg pain.

EXAM:
LUMBAR SPINE - COMPLETE 4+ VIEW

[l-spine ap]
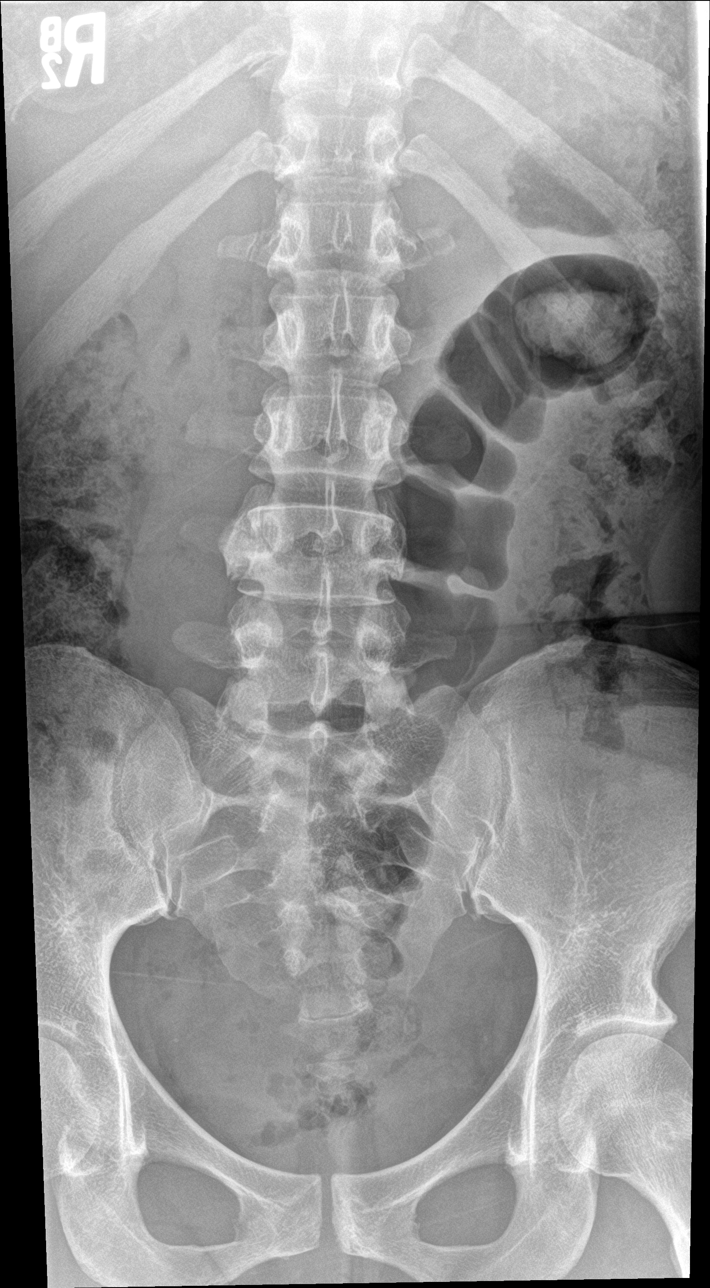

[l-spine obl (1 of 2)]
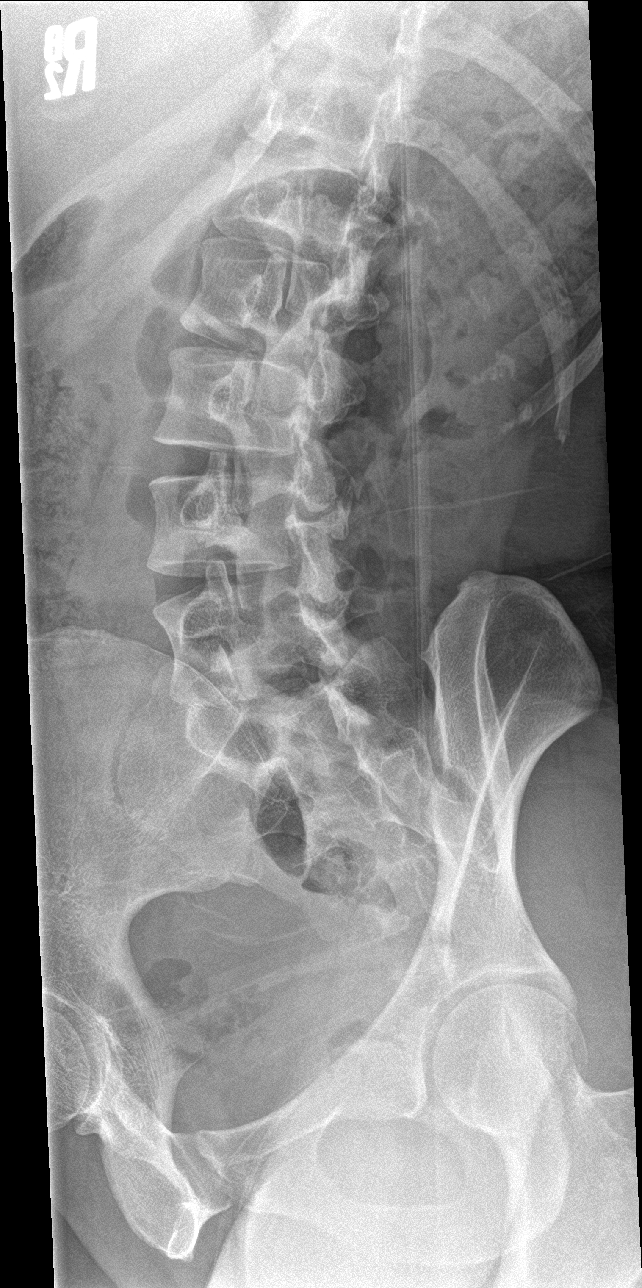

[l-spine obl (2 of 2)]
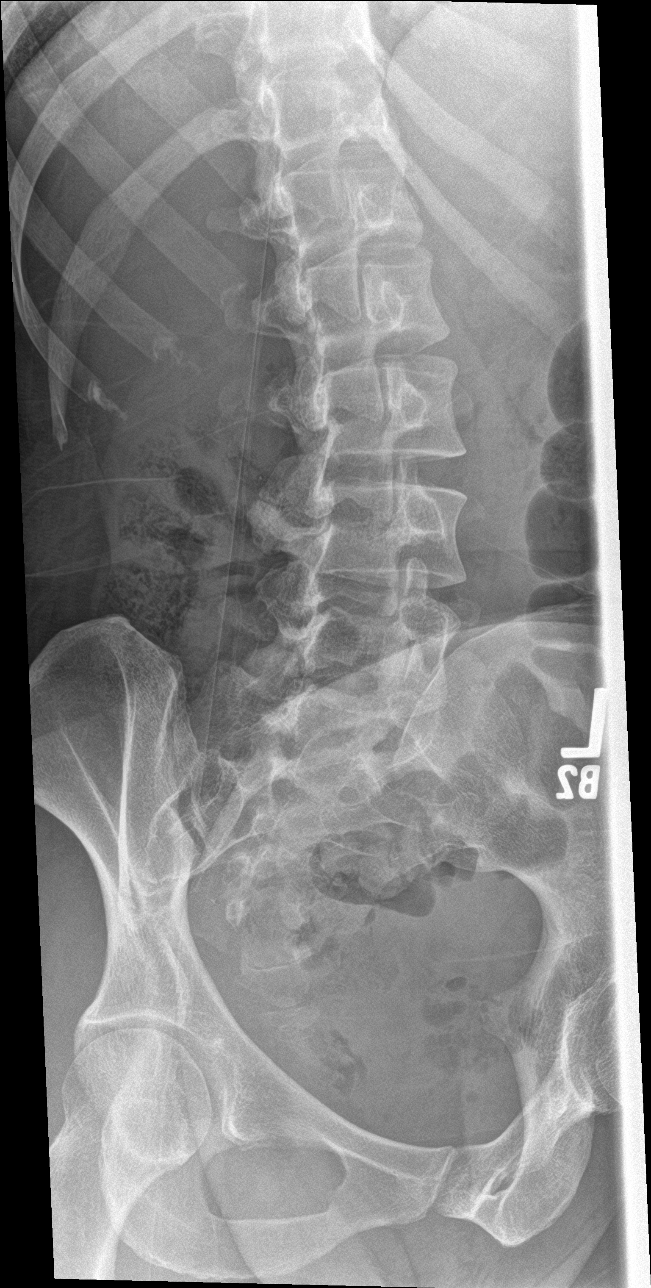

[l-spine lat]
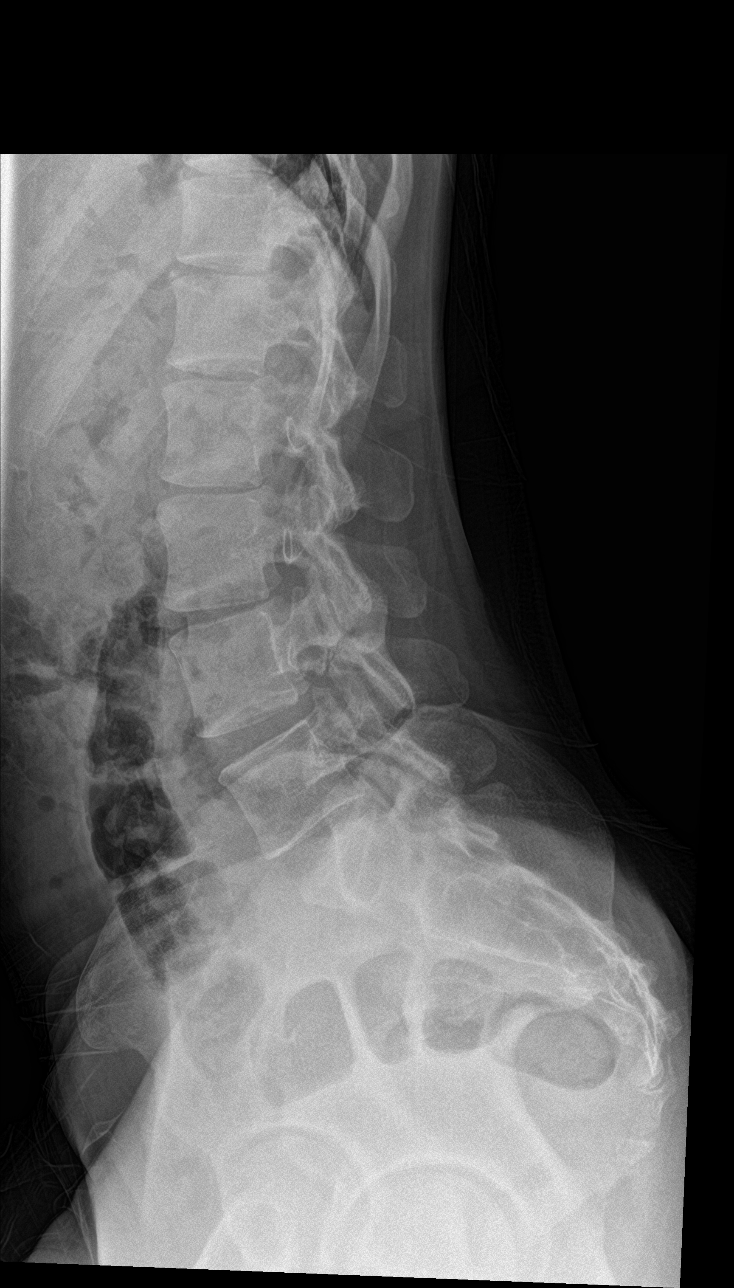

[l-spine spot]
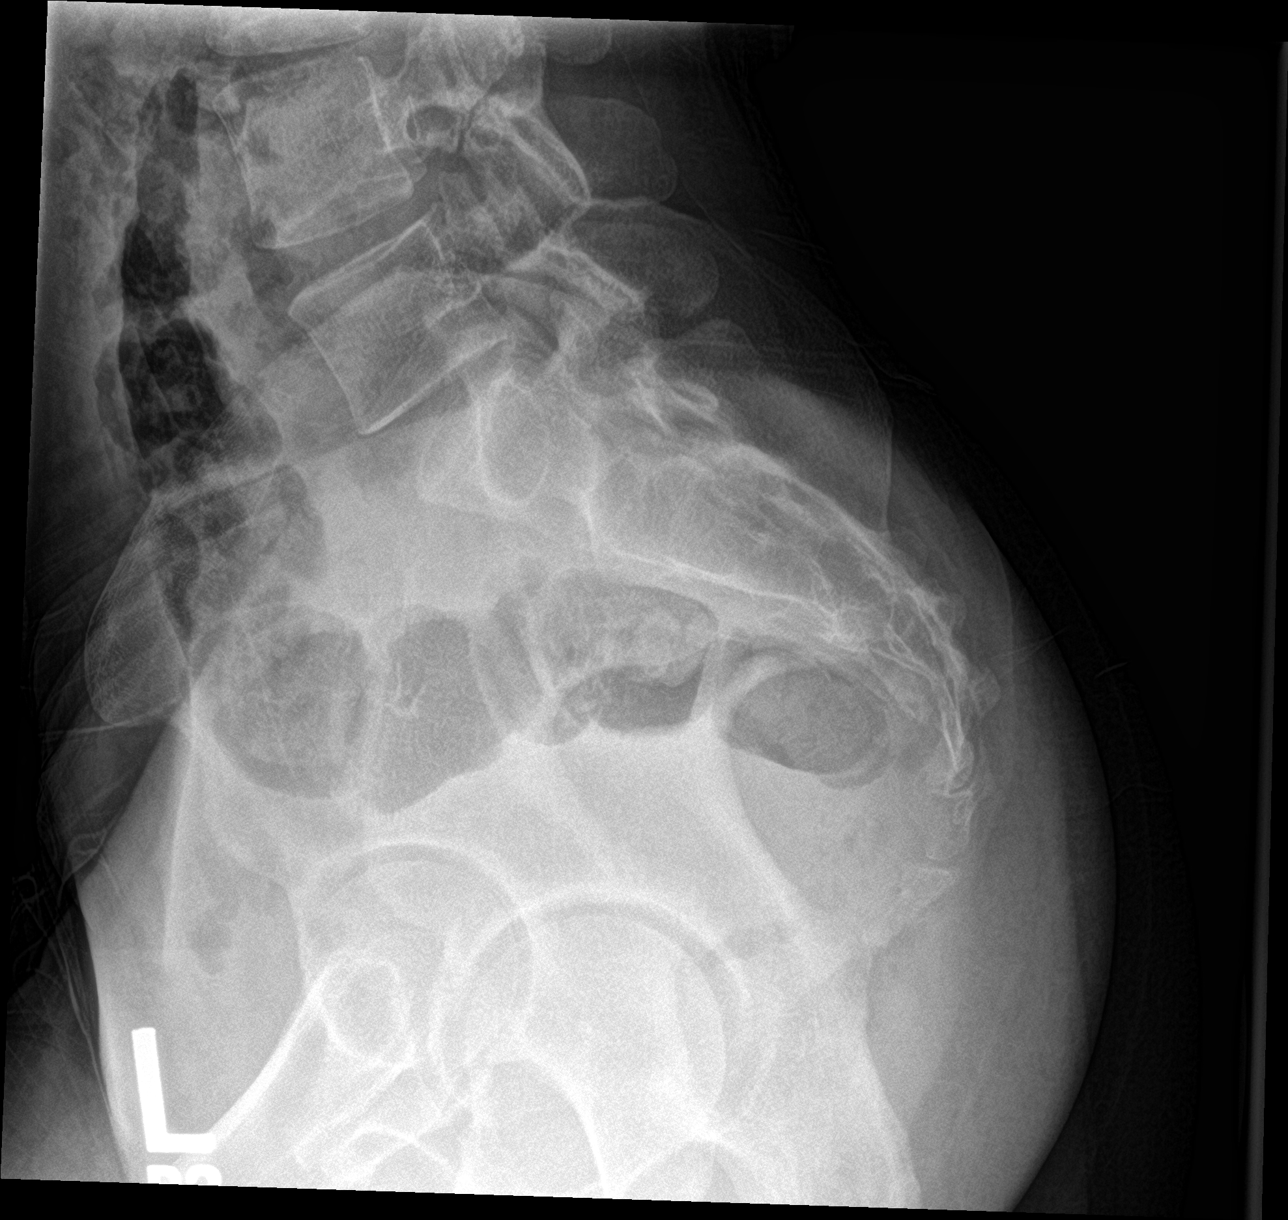

[5 of 5 positions shown; findings below may reference images not displayed]

FINDINGS: Suspected L4-5 level pars defects. No malalignment. No loss of
vertebral body or disc space height. No pelvic diastases. Intact
sacroiliac joints and symphysis pubis.
IMPRESSION: Suspected L4-5 pars defects, age indeterminate. No malalignment or
loss of vertebral body height.

By: Dleke Tiger M.D.

## 2019-07-10 NOTE — Progress Notes (Signed)
Patient said she had to reschedule

## 2019-07-11 ENCOUNTER — Other Ambulatory Visit: Payer: Self-pay

## 2019-07-12 ENCOUNTER — Encounter: Payer: Self-pay | Admitting: Neurology

## 2019-07-12 ENCOUNTER — Other Ambulatory Visit: Payer: Self-pay

## 2019-07-12 ENCOUNTER — Telehealth: Admitting: Neurology

## 2019-08-16 ENCOUNTER — Ambulatory Visit: Attending: Internal Medicine

## 2019-08-16 DIAGNOSIS — Z20822 Contact with and (suspected) exposure to covid-19: Secondary | ICD-10-CM

## 2019-08-18 LAB — NOVEL CORONAVIRUS, NAA: SARS-CoV-2, NAA: NOT DETECTED

## 2019-08-28 ENCOUNTER — Encounter: Payer: Self-pay | Admitting: Neurology

## 2019-08-28 NOTE — Progress Notes (Signed)
Virtual Visit via Video Note The purpose of this virtual visit is to provide medical care while limiting exposure to the novel coronavirus.    Consent was obtained for video visit:  Yes.   Answered questions that patient had about telehealth interaction:  Yes.   I discussed the limitations, risks, security and privacy concerns of performing an evaluation and management service by telemedicine. I also discussed with the patient that there may be a patient responsible charge related to this service. The patient expressed understanding and agreed to proceed.  Pt location: Home Physician Location: office Name of referring provider:  No ref. provider found I connected with Kimberlynn N Stumpp at patients initiation/request on 08/29/2019 at  7:50 AM EST by video enabled telemedicine application and verified that I am speaking with the correct person using two identifiers. Pt MRN:  [id number] Pt DOB:  02/28/1981 Video Participants:  Gabriela Eves   History of Present Illness:  Tara Willis is a 39 year old female who presents for headache.  History supplemented by referring provider note.  Onset:  39 years old Location:  Usually bifrontal-temporal; if severe, back of head and down neck and spine to low back. Quality:  Pounding, stabbing; sometimes pressure Intensity:  Severe.  She denies new headache, thunderclap headache Aura:  none Premonitory Phase:  none Postdrome:  fatigue Associated symptoms:  Nausea, photophobia, phonophobia, osmophobia, sometimes a little blurred vision.  She denies associated vomiting, unilateral numbness or weakness. Duration:  48 to 72 hours Frequency:  2 to 3 times a month However, she has underlying near-daily headaches Frequency of abortive medication: 25 headache days a month Triggers:  unknown Exacerbating factors:  Uncomfortable to sleep on back Relieving factors:  Laying in dark room for a couple of hours, hydration Activity:  aggravates  To evaluate  headaches, she had an MRI of brain without contrast on 04/12/2010, which was personally reviewed and was normal.  Rescue therapy:  Tramadol; ibuprofen with Tylenol Current NSAIDS:  Ibuprofen 600mg  Current analgesics:  Tramadol 50mg , Tylenol Current triptans:  none Current ergotamine:  none Current anti-emetic:  none Current muscle relaxants:  none Current anti-anxiolytic:  none Current sleep aide:  none Current Antihypertensive medications:  none Current Antidepressant medications:  none Current Anticonvulsant medications:  none Current anti-CGRP:  none Current Vitamins/Herbal/Supplements:  none Current Antihistamines/Decongestants:  none Other therapy:  none Hormone/birth control:  none  Past NSAIDS:  Toradol shots (effective); ibuprofen Past analgesics:  Tylenol (ineffective) Past abortive triptans:  Sumatriptan, rizatriptan Past abortive ergotamine:  none Past muscle relaxants:  Flexeril Past anti-emetic:  Zofran ODT 8mg  Past antihypertensive medications:  propranolol Past antidepressant medications:  Amitriptyline, sertraline 100mg  daily Past anticonvulsant medications:  topiramate Past anti-CGRP:  none Past vitamins/Herbal/Supplements:  none Past antihistamines/decongestants:  none Other past therapies:  none  Caffeine:  No coffee.  Rarely soda Alcohol:  no Smoker:  no Diet:  Hydrates. Exercise:  yes Depression:  yes; Anxiety:  yes Other pain:  Low back pain, neck and shoulder pain Sleep hygiene:  Poor - difficult to fall asleep Family history of headache:  Mother (migraines)  Past Medical History: Past Medical History:  Diagnosis Date  . Postpartum care following vaginal delivery (3/20) 10/21/2015  . S/P bilateral tubal ligation (3/21) 10/21/2015    Medications: Outpatient Encounter Medications as of 08/29/2019  Medication Sig  . ibuprofen (ADVIL,MOTRIN) 600 MG tablet Take 1 tablet (600 mg total) by mouth every 6 (six) hours as needed.  . traMADol (ULTRAM) 50  MG tablet Take 50 mg by mouth every 6 (six) hours.   No facility-administered encounter medications on file as of 08/29/2019.    Allergies: Allergies  Allergen Reactions  . Poultry Meal Anaphylaxis  . Sesame Oil Anaphylaxis  . Eggs Or Egg-Derived Products Hives    Family History: Family History  Problem Relation Age of Onset  . Migraines Mother   . Ataxia Father   . Healthy Sister   . Healthy Brother        oldest  . Migraines Brother        youngest  . Healthy Child     Social History: Social History   Socioeconomic History  . Marital status: Married    Spouse name: Not on file  . Number of children: 3  . Years of education: Not on file  . Highest education level: Some college, no degree  Occupational History  . Not on file  Tobacco Use  . Smoking status: Never Smoker  . Smokeless tobacco: Never Used  Substance and Sexual Activity  . Alcohol use: Yes    Alcohol/week: 1.0 standard drinks    Types: 1 Glasses of wine per week    Comment: socially   . Drug use: No  . Sexual activity: Not on file  Other Topics Concern  . Not on file  Social History Narrative   legally seprated   3 children   Some college   Right handed   Does not drink coffee, tea or soda   Social Determinants of Health   Financial Resource Strain:   . Difficulty of Paying Living Expenses: Not on file  Food Insecurity:   . Worried About Programme researcher, broadcasting/film/video in the Last Year: Not on file  . Ran Out of Food in the Last Year: Not on file  Transportation Needs:   . Lack of Transportation (Medical): Not on file  . Lack of Transportation (Non-Medical): Not on file  Physical Activity:   . Days of Exercise per Week: Not on file  . Minutes of Exercise per Session: Not on file  Stress:   . Feeling of Stress : Not on file  Social Connections:   . Frequency of Communication with Friends and Family: Not on file  . Frequency of Social Gatherings with Friends and Family: Not on file  . Attends  Religious Services: Not on file  . Active Member of Clubs or Organizations: Not on file  . Attends Banker Meetings: Not on file  . Marital Status: Not on file  Intimate Partner Violence:   . Fear of Current or Ex-Partner: Not on file  . Emotionally Abused: Not on file  . Physically Abused: Not on file  . Sexually Abused: Not on file    Observations/Objective:   Height 5' (1.524 m), weight 102 lb (46.3 kg), unknown if currently breastfeeding. No acute distress.  Alert and oriented.  Speech fluent and not dysarthric.  Language intact.  Eyes orthophoric on primary gaze.  Face symmetric.  Assessment and Plan:   Chronic migraine without aura, without status migrainosus, intractable.  1.  For preventative management, will start Emgality 2.  For abortive therapy, she will try Nurtec 3.  Limit use of pain relievers to no more than 2 days out of week to prevent risk of rebound or medication-overuse headache. 4.  Keep headache diary 5.  Exercise, hydration, caffeine cessation, sleep hygiene, monitor for and avoid triggers 6. Follow up 4 months.   Follow Up Instructions:    -  I discussed the assessment and treatment plan with the patient. The patient was provided an opportunity to ask questions and all were answered. The patient agreed with the plan and demonstrated an understanding of the instructions.   The patient was advised to call back or seek an in-person evaluation if the symptoms worsen or if the condition fails to improve as anticipated.    Dudley Major, DO

## 2019-08-29 ENCOUNTER — Telehealth (INDEPENDENT_AMBULATORY_CARE_PROVIDER_SITE_OTHER): Admitting: Neurology

## 2019-08-29 ENCOUNTER — Other Ambulatory Visit: Payer: Self-pay

## 2019-08-29 VITALS — Ht 60.0 in | Wt 102.0 lb

## 2019-08-29 DIAGNOSIS — G43719 Chronic migraine without aura, intractable, without status migrainosus: Secondary | ICD-10-CM | POA: Diagnosis not present

## 2019-08-29 MED ORDER — EMGALITY 120 MG/ML ~~LOC~~ SOAJ: 120.0000 mg | SUBCUTANEOUS | 11 refills | Status: AC

## 2019-08-29 MED ORDER — EMGALITY 120 MG/ML ~~LOC~~ SOAJ: 240.0000 mg | Freq: Once | SUBCUTANEOUS | 0 refills | Status: AC

## 2019-08-29 NOTE — Patient Instructions (Signed)
Migraine Recommendations: 1.  Start Emgality injection.  First dose, you must inject yourself twice (2 autoinjectors).  Then it is one injection/pen every 28 days.  Most people inject in thigh or stomach but ask the pharmacist how to administer. 2.  Take Nurtec for migraine attack.  No more than 1 tablet in 24 hours.  It is a dissolvable pill.  If effective, contact me and I will send in prescription.  3.  Limit use of pain relievers to no more than 2 days out of the week.  These medications include acetaminophen, ibuprofen, triptans and narcotics.  This will help reduce risk of rebound headaches. 4.  Be aware of common food triggers such as processed sweets, processed foods with nitrites (such as deli meat, hot dogs, sausages), foods with MSG, alcohol (such as wine), chocolate, certain cheeses, certain fruits (dried fruits, bananas, pineapple), vinegar, diet soda. 4.  Avoid caffeine 5.  Routine exercise 6.  Proper sleep hygiene 7.  Stay adequately hydrated with water 8.  Keep a headache diary. 9.  Maintain proper stress management. 10.  Do not skip meals. 11.  Consider supplements:  Magnesium citrate 400mg  to 600mg  daily, riboflavin 400mg , Coenzyme Q 10 100mg  three times daily

## 2019-08-29 NOTE — Addendum Note (Signed)
Addended byEverlena Cooper, Duvall Comes R on: 08/29/2019 08:20 AM   Modules accepted: Level of Service

## 2019-08-30 ENCOUNTER — Other Ambulatory Visit: Payer: Self-pay

## 2019-08-30 ENCOUNTER — Encounter: Payer: Self-pay | Admitting: *Deleted

## 2019-08-30 ENCOUNTER — Telehealth: Payer: Self-pay | Admitting: Neurology

## 2019-08-30 NOTE — Telephone Encounter (Signed)
Patient called regarding her taking some samples of Nurtec at the start of her Migraine and sahe said that it really helped her a lot. She would like to have that Prescription called into [facility] on [address] and [address] [place]. She also wanted to let Dr. Everlena Cooper know that her prescription for Emgality is being delayed due to insurance Approval. Thank you

## 2019-08-30 NOTE — Progress Notes (Addendum)
Alpha Kelleher Key: [id number] - PA Case ID: [id number] - Rx #: [id number] Need help? Call us at [phone] Outcome Approvedtoday CaseId:[id number];Status:Approved;Review Type:Prior Auth;Coverage Start Date:07/31/2019;Coverage End Date:02/26/2020;;CaseId:[id number];Status:Cancelled;Explanation:This medication has been approved. Coverage end date for the drug:02/26/2020;

## 2019-08-31 ENCOUNTER — Other Ambulatory Visit: Payer: Self-pay | Admitting: Neurology

## 2019-08-31 MED ORDER — NURTEC 75 MG PO TBDP: 75.0000 mg | ORAL_TABLET | Freq: Every day | ORAL | 11 refills | Status: DC | PRN

## 2019-08-31 NOTE — Telephone Encounter (Signed)
Please let patient know that I sent Nurtec to [facility] on [address]

## 2019-08-31 NOTE — Progress Notes (Signed)
nurtec sent to pharmacy

## 2019-09-03 ENCOUNTER — Encounter: Payer: Self-pay | Admitting: *Deleted

## 2019-09-03 NOTE — Progress Notes (Addendum)
Tara Willis (Key: [id number]) Nurtec 75MG  dispersible tablets   Form PA Form Created 8 days ago Sent to Plan 8 days ago Plan Response 8 days ago Submit Clinical Questions 8 days ago Determination Unfavorable 1 minute ago Message from Plan CaseId:[id number];Status:Denied;Review Type:Prior Auth;Appeal Information: Attention:ATTN: CLINICAL APPEALS DEPARTMENT [employer] [address] Administrator, arts. [phone] Phone:[phone] Fax:[phone]; Important - Please read the below note on eAppeals: Please reference the denial letter for information on the rights for an appeal, rationale for the denial, and how to submit an appeal including if any information is needed to support the appeal. Note about urgent situations - Generally, an urgent situation is one which, in the opinion of the provider, the health of the patient may be in serious jeopardy or may experience pain that cannot be adequately controlled while waiting for a decision on the appeal.;

## 2019-09-12 NOTE — Progress Notes (Signed)
Called [phone] Option#4 [employer] member Spoke with Denice Bors case# [id number] I told her we never received further questions so the previous case was denied. She said she will initiate a new PA over the phone and it was approved 08/13/2019-08/01/2098

## 2019-09-29 ENCOUNTER — Ambulatory Visit: Attending: Internal Medicine

## 2019-09-29 DIAGNOSIS — Z23 Encounter for immunization: Secondary | ICD-10-CM | POA: Insufficient documentation

## 2019-09-29 NOTE — Progress Notes (Signed)
   Covid-19 Vaccination Clinic  Name:  AVILENE MARRIN    MRN: [id number] DOB: [birth year]  09/29/2019  Ms. Niehaus was observed post Covid-19 immunization for 15 minutes without incidence. She was provided with Vaccine Information Sheet and instruction to access the V-Safe system.   Ms. Rhein was instructed to call 911 with any severe reactions post vaccine: Marland Kitchen Difficulty breathing  . Swelling of your face and throat  . A fast heartbeat  . A bad rash all over your body  . Dizziness and weakness    Immunizations Administered    Name Date Dose VIS Date Route   Pfizer COVID-19 Vaccine 09/29/2019  1:32 PM 0.3 mL 07/13/2019 Intramuscular   Manufacturer: [employer], [employer]   Lot: [id number]   NDC: [address]-9

## 2019-10-20 ENCOUNTER — Ambulatory Visit: Attending: Internal Medicine

## 2019-10-20 DIAGNOSIS — Z23 Encounter for immunization: Secondary | ICD-10-CM

## 2019-10-20 NOTE — Progress Notes (Signed)
   Covid-19 Vaccination Clinic  Name:  Tara Willis    MRN: [id number] DOB: 01-Aug-1981  10/20/2019  [name] was observed post Covid-19 immunization for 15 minutes without incident. She was provided with Vaccine Information Sheet and instruction to access the V-Safe system.   [name] was instructed to call 911 with any severe reactions post vaccine: Marland Kitchen Difficulty breathing  . Swelling of face and throat  . A fast heartbeat  . A bad rash all over body  . Dizziness and weakness   Immunizations Administered    Name Date Dose VIS Date Route   Pfizer COVID-19 Vaccine 10/20/2019  9:07 AM 0.3 mL 07/13/2019 Intramuscular   Manufacturer: [employer], [employer]   Lot: [id number]   NDC: [address]-1

## 2019-12-26 NOTE — Progress Notes (Signed)
NEUROLOGY FOLLOW UP OFFICE NOTE  Tara Willis [id number]  HISTORY OF PRESENT ILLNESS: Tara Willis is a 39 year old female who follows up for migraines.  UPDATE: Started Emgality.  Intensity:  severe Duration:  Treats early with Nurtec and aborts quickly Frequency:  2 in past 30 days Frequency of abortive medication: 2  Rescue therapy:  Nurtec Current NSAIDS:  Ibuprofen 600mg  Current analgesics:  Tramadol 50mg , Tylenol Current triptans:  none Current ergotamine:  none Current anti-emetic:  none Current muscle relaxants:  none Current anti-anxiolytic:  none Current sleep aide:  none Current Antihypertensive medications:  none Current Antidepressant medications:  none Current Anticonvulsant medications:  none Current anti-CGRP:  Emgality; Nurtec Current Vitamins/Herbal/Supplements:  none Current Antihistamines/Decongestants:  none Other therapy:  none Hormone/birth control:  none  Caffeine:  No coffee.  Rarely soda Alcohol:  no Smoker:  no Diet:  Hydrates. Exercise:  yes Depression:  yes; Anxiety:  yes Other pain:  Low back pain, neck and shoulder pain Sleep hygiene:  Poor - difficult to fall asleep  HISTORY: Onset:  39 years old Location:  Usually bifrontal-temporal; if severe, back of head and down neck and spine to low back. Quality:  Pounding, stabbing; sometimes pressure Initial intensity:  Severe.  She denies new headache, thunderclap headache Aura:  none Premonitory Phase:  none Postdrome:  fatigue Associated symptoms:  Nausea, photophobia, phonophobia, osmophobia, sometimes a little blurred vision.  She denies associated vomiting, unilateral numbness or weakness. Initial duration:  48 to 72 hours Initial frequency:  2 to 3 times a month However, she has underlying near-daily headaches Initial frequency of abortive medication: 25 headache days a month Triggers:  unknown Exacerbating factors:  Uncomfortable to sleep on back Relieving factors:  Laying  in dark room for a couple of hours, hydration Activity:  aggravates  To evaluate headaches, she had an MRI of brain without contrast on 04/12/2010, which was normal.   Past NSAIDS:  Toradol shots (effective); ibuprofen Past analgesics:  Tylenol (ineffective) Past abortive triptans:  Sumatriptan, rizatriptan Past abortive ergotamine:  none Past muscle relaxants:  Flexeril Past anti-emetic:  Zofran ODT 8mg  Past antihypertensive medications:  propranolol Past antidepressant medications:  Amitriptyline, sertraline 100mg  daily Past anticonvulsant medications:  topiramate Past anti-CGRP:  none Past vitamins/Herbal/Supplements:  none Past antihistamines/decongestants:  none Other past therapies:  none   Family history of headache:  Mother (migraines)  PAST MEDICAL HISTORY: Past Medical History:  Diagnosis Date  . Postpartum care following vaginal delivery (3/20) 10/21/2015  . S/P bilateral tubal ligation (3/21) 10/21/2015    MEDICATIONS: Current Outpatient Medications on File Prior to Visit  Medication Sig Dispense Refill  . Galcanezumab-gnlm (EMGALITY) 120 MG/ML SOAJ Inject 120 mg into the skin every 28 (twenty-eight) days. 1 pen 11  . ibuprofen (ADVIL,MOTRIN) 600 MG tablet Take 1 tablet (600 mg total) by mouth every 6 (six) hours as needed. 30 tablet 0  . Rimegepant Sulfate (NURTEC) 75 MG TBDP Take 75 mg by mouth daily as needed (Maximum 1 tablet in 24 hours). 8 tablet 11  . traMADol (ULTRAM) 50 MG tablet Take 50 mg by mouth every 6 (six) hours.     No current facility-administered medications on file prior to visit.    ALLERGIES: Allergies  Allergen Reactions  . Poultry Meal Anaphylaxis  . Sesame Oil Anaphylaxis  . Eggs Or Egg-Derived Products Hives    FAMILY HISTORY: Family History  Problem Relation Age of Onset  . Migraines Mother   . Ataxia Father   .  Healthy Sister   . Healthy Brother        oldest  . Migraines Brother        youngest  . Healthy Child      SOCIAL HISTORY: Social History   Socioeconomic History  . Marital status: Married    Spouse name: Not on file  . Number of children: 3  . Years of education: Not on file  . Highest education level: Some college, no degree  Occupational History  . Not on file  Tobacco Use  . Smoking status: Never Smoker  . Smokeless tobacco: Never Used  Substance and Sexual Activity  . Alcohol use: Yes    Alcohol/week: 1.0 standard drinks    Types: 1 Glasses of wine per week    Comment: socially   . Drug use: No  . Sexual activity: Not on file  Other Topics Concern  . Not on file  Social History Narrative   legally seprated   3 children   Some college   Right handed   Does not drink coffee, tea or soda   Social Determinants of Health   Financial Resource Strain:   . Difficulty of Paying Living Expenses:   Food Insecurity:   . Worried About Charity fundraiser in the Last Year:   . Arboriculturist in the Last Year:   Transportation Needs:   . Film/video editor (Medical):   Marland Kitchen Lack of Transportation (Non-Medical):   Physical Activity:   . Days of Exercise per Week:   . Minutes of Exercise per Session:   Stress:   . Feeling of Stress :   Social Connections:   . Frequency of Communication with Friends and Family:   . Frequency of Social Gatherings with Friends and Family:   . Attends Religious Services:   . Active Member of Clubs or Organizations:   . Attends Archivist Meetings:   Marland Kitchen Marital Status:   Intimate Partner Violence:   . Fear of Current or Ex-Partner:   . Emotionally Abused:   Marland Kitchen Physically Abused:   . Sexually Abused:     PHYSICAL EXAM: Blood pressure 114/77, pulse 73, height 5' (1.524 m), weight 105 lb 3.2 oz (47.7 kg), SpO2 100 %, unknown if currently breastfeeding. General: No acute distress.  Patient appears well-groomed.   Head:  Normocephalic/atraumatic Eyes:  Fundi examined but not visualized Neck: supple, no paraspinal tenderness, full  range of motion Heart:  Regular rate and rhythm Lungs:  Clear to auscultation bilaterally Back: No paraspinal tenderness Neurological Exam: alert and oriented to person, place, and time. Attention span and concentration intact, recent and remote memory intact, fund of knowledge intact.  Speech fluent and not dysarthric, language intact.  CN II-XII intact. Bulk and tone normal, muscle strength 5/5 throughout.  Sensation to light touch, temperature and vibration intact.  Deep tendon reflexes 2+ throughout, toes downgoing.  Finger to nose and heel to shin testing intact.  Gait normal, Romberg negative.  IMPRESSION: Migraine without aura, without status migrainosus, not intractable  PLAN: 1.  For preventative management, Emgality 2.  For abortive therapy, Nurtec 3.  Limit use of pain relievers to no more than 2 days out of week to prevent risk of rebound or medication-overuse headache. 4.  Keep headache diary 5.  Exercise, hydration, caffeine cessation, sleep hygiene, monitor for and avoid triggers 6.  Follow up 6 months   Metta Clines, DO

## 2019-12-27 ENCOUNTER — Ambulatory Visit (INDEPENDENT_AMBULATORY_CARE_PROVIDER_SITE_OTHER): Admitting: Neurology

## 2019-12-27 ENCOUNTER — Other Ambulatory Visit: Payer: Self-pay

## 2019-12-27 ENCOUNTER — Encounter: Payer: Self-pay | Admitting: Neurology

## 2019-12-27 VITALS — BP 114/77 | HR 73 | Ht 60.0 in | Wt 105.2 lb

## 2019-12-27 DIAGNOSIS — G43009 Migraine without aura, not intractable, without status migrainosus: Secondary | ICD-10-CM | POA: Diagnosis not present

## 2019-12-27 MED ORDER — NURTEC 75 MG PO TBDP: 75.0000 mg | ORAL_TABLET | Freq: Every day | ORAL | 5 refills | Status: DC | PRN

## 2019-12-27 NOTE — Patient Instructions (Signed)
1.  Continue Emgality and Nurtec 2.  Follow up in 6 months.

## 2020-04-10 ENCOUNTER — Other Ambulatory Visit: Payer: Self-pay

## 2020-04-10 ENCOUNTER — Other Ambulatory Visit: Payer: Self-pay | Admitting: Neurology

## 2020-04-10 MED ORDER — NURTEC 75 MG PO TBDP: 75.0000 mg | ORAL_TABLET | Freq: Every day | ORAL | 5 refills | Status: AC | PRN

## 2020-04-10 NOTE — Telephone Encounter (Signed)
Refill sent to pharmacy.   

## 2020-04-10 NOTE — Telephone Encounter (Signed)
Patient called in wanting a refill of her Nurtec to [facility] on [address].

## 2020-06-24 NOTE — Progress Notes (Deleted)
NEUROLOGY FOLLOW UP OFFICE NOTE  ROSEANNA KOPLIN [id number]   Subjective:  Tara Willis is a 39 year old female who follows up for migraines.  UPDATE: Intensity:  severe Duration:  Treats early with Nurtec and aborts quickly Frequency:  2 in past 30 days Frequency of abortive medication: 2  Rescue therapy: Nurtec Current NSAIDS:Ibuprofen 600mg  Current analgesics:Tramadol 50mg , Tylenol Current triptans:none Current ergotamine:none Current anti-emetic:none Current muscle relaxants:none Current anti-anxiolytic:none Current sleep aide:none Current Antihypertensive medications:none Current Antidepressant medications:none Current Anticonvulsant medications:none Current anti-CGRP:Emgality; Nurtec Current Vitamins/Herbal/Supplements:none Current Antihistamines/Decongestants:none Other therapy:none Hormone/birth control:none  Caffeine:No coffee. Rarely soda Alcohol:no Smoker:no Diet:Hydrates. Exercise:yes Depression:yes; Anxiety:yes Other pain:Low back pain, neck and shoulder pain Sleep hygiene:Poor - difficult to fall asleep  HISTORY: Onset:39 years old Location:Usually bifrontal-temporal; if severe, back of head and down neck and spine to low back. Quality:Pounding, stabbing; sometimes pressure Initial intensity:Severe.Shedenies new headache, thunderclap headache Aura:none Premonitory Phase:none Postdrome:fatigue Associated symptoms:Nausea, photophobia, phonophobia, osmophobia, sometimes a little blurred vision.Shedenies associated vomiting,unilateral numbness or weakness. Initial duration:48 to 72 hours Initial frequency:2 to 3 times a month However, she has underlying near-daily headaches Initial frequency of abortive medication:25 headache days a month Triggers:unknown Exacerbating factors:Uncomfortable to sleep on back Relieving factors:Laying in dark room for a couple of  hours, hydration Activity:aggravates  To evaluate headaches, she had an MRI of brain without contrast on 04/12/2010, which was normal.   Past NSAIDS:Toradol shots (effective); ibuprofen Past analgesics:Tylenol (ineffective) Past abortive triptans:Sumatriptan, rizatriptan Past abortive ergotamine:none Past muscle relaxants:Flexeril Past anti-emetic:Zofran ODT 8mg  Past antihypertensive medications:propranolol Past antidepressant medications:Amitriptyline, sertraline 100mg  daily Past anticonvulsant medications:topiramate Past anti-CGRP:none Past vitamins/Herbal/Supplements:none Past antihistamines/decongestants:none Other past therapies:none   Family history of headache:Mother (migraines)  PAST MEDICAL HISTORY: Past Medical History:  Diagnosis Date  . Postpartum care following vaginal delivery (3/20) 10/21/2015  . S/P bilateral tubal ligation (3/21) 10/21/2015    MEDICATIONS: Current Outpatient Medications on File Prior to Visit  Medication Sig Dispense Refill  . Galcanezumab-gnlm (EMGALITY) 120 MG/ML SOAJ Inject 120 mg into the skin every 28 (twenty-eight) days. 1 pen 11  . ibuprofen (ADVIL,MOTRIN) 600 MG tablet Take 1 tablet (600 mg total) by mouth every 6 (six) hours as needed. 30 tablet 0  . Rimegepant Sulfate (NURTEC) 75 MG TBDP Take 75 mg by mouth daily as needed (Maximum 1 tablet in 24 hours). 8 tablet 5  . traMADol (ULTRAM) 50 MG tablet Take 50 mg by mouth every 6 (six) hours.     No current facility-administered medications on file prior to visit.    ALLERGIES: Allergies  Allergen Reactions  . Poultry Meal Anaphylaxis  . Sesame Oil Anaphylaxis  . Eggs Or Egg-Derived Products Hives    FAMILY HISTORY: Family History  Problem Relation Age of Onset  . Migraines Mother   . Ataxia Father   . Healthy Sister   . Healthy Brother        oldest  . Migraines Brother        youngest  . Healthy Child     SOCIAL HISTORY: Social  History   Socioeconomic History  . Marital status: Married    Spouse name: Not on file  . Number of children: 3  . Years of education: Not on file  . Highest education level: Some college, no degree  Occupational History  . Not on file  Tobacco Use  . Smoking status: Never Smoker  . Smokeless tobacco: Never Used  Vaping Use  . Vaping Use: Never used  Substance and Sexual Activity  .  Alcohol use: Yes    Alcohol/week: 1.0 standard drink    Types: 1 Glasses of wine per week    Comment: socially   . Drug use: No  . Sexual activity: Not on file  Other Topics Concern  . Not on file  Social History Narrative   legally seprated   3 children, one story home   Some college   Right handed   Does not drink coffee, tea or soda   Social Determinants of Health   Financial Resource Strain:   . Difficulty of Paying Living Expenses: Not on file  Food Insecurity:   . Worried About Programme researcher, broadcasting/film/video in the Last Year: Not on file  . Ran Out of Food in the Last Year: Not on file  Transportation Needs:   . Lack of Transportation (Medical): Not on file  . Lack of Transportation (Non-Medical): Not on file  Physical Activity:   . Days of Exercise per Week: Not on file  . Minutes of Exercise per Session: Not on file  Stress:   . Feeling of Stress : Not on file  Social Connections:   . Frequency of Communication with Friends and Family: Not on file  . Frequency of Social Gatherings with Friends and Family: Not on file  . Attends Religious Services: Not on file  . Active Member of Clubs or Organizations: Not on file  . Attends Banker Meetings: Not on file  . Marital Status: Not on file  Intimate Partner Violence:   . Fear of Current or Ex-Partner: Not on file  . Emotionally Abused: Not on file  . Physically Abused: Not on file  . Sexually Abused: Not on file     Objective:  *** General: No acute distress.  Patient appears well-groomed.   Head:   Normocephalic/atraumatic Eyes:  Fundi examined but not visualized Neck: supple, no paraspinal tenderness, full range of motion Heart:  Regular rate and rhythm Lungs:  Clear to auscultation bilaterally Back: No paraspinal tenderness Neurological Exam: alert and oriented to person, place, and time. Attention span and concentration intact, recent and remote memory intact, fund of knowledge intact.  Speech fluent and not dysarthric, language intact.  CN II-XII intact. Bulk and tone normal, muscle strength 5/5 throughout.  Sensation to light touch, temperature and vibration intact.  Deep tendon reflexes 2+ throughout, toes downgoing.  Finger to nose and heel to shin testing intact.  Gait normal, Romberg negative.   Assessment/Plan:   Migraine without aura, without status migrainosus, not intractable  1.  Migraine prevention:  Emgality 2.  Migraine rescue:  Nurtecd 3.  Limit use of pain relievers to no more than 2 days out of week to prevent risk of rebound or medication-overuse headache. 4.  Keep headache diary 5.  Follow up ***  Shon Millet, DO

## 2020-06-30 ENCOUNTER — Ambulatory Visit: Admitting: Neurology

## 2022-11-16 ENCOUNTER — Emergency Department (HOSPITAL_BASED_OUTPATIENT_CLINIC_OR_DEPARTMENT_OTHER)
Admission: EM | Admit: 2022-11-16 | Discharge: 2022-11-16 | Disposition: A | Discharge status: Home / Self Care | Attending: Emergency Medicine | Admitting: Emergency Medicine

## 2022-11-16 ENCOUNTER — Other Ambulatory Visit: Payer: Self-pay

## 2022-11-16 ENCOUNTER — Encounter (HOSPITAL_BASED_OUTPATIENT_CLINIC_OR_DEPARTMENT_OTHER): Payer: Self-pay

## 2022-11-16 ENCOUNTER — Emergency Department (HOSPITAL_BASED_OUTPATIENT_CLINIC_OR_DEPARTMENT_OTHER)

## 2022-11-16 DIAGNOSIS — E876 Hypokalemia: Secondary | ICD-10-CM | POA: Diagnosis not present

## 2022-11-16 DIAGNOSIS — K029 Dental caries, unspecified: Secondary | ICD-10-CM | POA: Diagnosis not present

## 2022-11-16 DIAGNOSIS — R519 Headache, unspecified: Secondary | ICD-10-CM | POA: Insufficient documentation

## 2022-11-16 DIAGNOSIS — K0889 Other specified disorders of teeth and supporting structures: Secondary | ICD-10-CM | POA: Diagnosis present

## 2022-11-16 LAB — CBC WITH DIFFERENTIAL/PLATELET
Abs Immature Granulocytes: 0.03 10*3/uL (ref 0.00–0.07)
Basophils Absolute: 0 10*3/uL (ref 0.0–0.1)
Basophils Relative: 0 %
Eosinophils Absolute: 0.1 10*3/uL (ref 0.0–0.5)
Eosinophils Relative: 1 %
HCT: 36.2 % (ref 36.0–46.0)
Hemoglobin: 12.2 g/dL (ref 12.0–15.0)
Immature Granulocytes: 0 %
Lymphocytes Relative: 29 %
Lymphs Abs: 2.5 10*3/uL (ref 0.7–4.0)
MCH: 27.7 pg (ref 26.0–34.0)
MCHC: 33.7 g/dL (ref 30.0–36.0)
MCV: 82.3 fL (ref 80.0–100.0)
Monocytes Absolute: 0.4 10*3/uL (ref 0.1–1.0)
Monocytes Relative: 4 %
Neutro Abs: 5.7 10*3/uL (ref 1.7–7.7)
Neutrophils Relative %: 66 %
Platelets: 256 10*3/uL (ref 150–400)
RBC: 4.4 MIL/uL (ref 3.87–5.11)
RDW: 16.1 % — ABNORMAL HIGH (ref 11.5–15.5)
WBC: 8.6 10*3/uL (ref 4.0–10.5)
nRBC: 0 % (ref 0.0–0.2)

## 2022-11-16 LAB — COMPREHENSIVE METABOLIC PANEL
ALT: 10 U/L (ref 0–44)
AST: 18 U/L (ref 15–41)
Albumin: 3.9 g/dL (ref 3.5–5.0)
Alkaline Phosphatase: 46 U/L (ref 38–126)
Anion gap: 6 (ref 5–15)
BUN: 9 mg/dL (ref 6–20)
CO2: 24 mmol/L (ref 22–32)
Calcium: 8.7 mg/dL — ABNORMAL LOW (ref 8.9–10.3)
Chloride: 106 mmol/L (ref 98–111)
Creatinine, Ser: 0.76 mg/dL (ref 0.44–1.00)
GFR, Estimated: >60 mL/min (ref >60)
Glucose, Bld: 112 mg/dL — ABNORMAL HIGH (ref 70–99)
Potassium: 3.2 mmol/L — ABNORMAL LOW (ref 3.5–5.1)
Sodium: 136 mmol/L (ref 135–145)
Total Bilirubin: 0.7 mg/dL (ref 0.3–1.2)
Total Protein: 6.8 g/dL (ref 6.5–8.1)

## 2022-11-16 LAB — HCG, QUANTITATIVE, PREGNANCY: hCG, Beta Chain, Quant, S: <1 m[IU]/mL (ref <5)

## 2022-11-16 MED ORDER — OXYCODONE-ACETAMINOPHEN 5-325 MG PO TABS
1.0000 | ORAL_TABLET | Freq: Once | ORAL | Status: AC
  Administered 2022-11-16: 1 via ORAL
  Filled 2022-11-16: qty 1

## 2022-11-16 MED ORDER — HYDROCODONE-ACETAMINOPHEN 5-325 MG PO TABS
2.0000 | ORAL_TABLET | Freq: Once | ORAL | Status: AC
  Administered 2022-11-16: 2 via ORAL
  Filled 2022-11-16: qty 2

## 2022-11-16 MED ORDER — HYDROCODONE-ACETAMINOPHEN 5-325 MG PO TABS: 2.0000 | ORAL_TABLET | ORAL | 0 refills | Status: AC | PRN

## 2022-11-16 NOTE — ED Provider Notes (Signed)
[place] [facility]GENCY DEPARTMENT AT [facility] Provider Note   CSN: [id number] Arrival date & time: 11/16/22  2015     History Chief Complaint  Patient presents with   Dental Problem    Tara Willis is a 42 y.o. female. Patient presented to the ED for dental pain. She reports that she was seen by her dentist today and they are concerned for dental infection. She states dentist believes she needs to have tooth extracted but cannot at this point due to concern for infection. Dentist started her on amoxicillin and patient has been able to take one dose. Still reporting notable pain in the right lower jaw. I was initially called into triage patient given concerns for right sided facial numbness. On my initially assessment, NIH score was 1 due to right sided facial numbness. She states that she briefly had felt her entire right side was numb at home but this quickly resolve to only be right sided facial numbness.  HPI     Home Medications Prior to Admission medications   Medication Sig Start Date End Date Taking? Authorizing Provider  HYDROcodone-acetaminophen (NORCO/VICODIN) 5-325 MG tablet Take 2 tablets by mouth every 4 (four) hours as needed. 11/16/22  Yes Symon Norwood A, PA-C  Galcanezumab-gnlm (EMGALITY) 120 MG/ML SOAJ Inject 120 mg into the skin every 28 (twenty-eight) days. 08/29/19   Drema Dallas, DO  ibuprofen (ADVIL,MOTRIN) 600 MG tablet Take 1 tablet (600 mg total) by mouth every 6 (six) hours as needed. 02/06/18   Aviva Kluver B, PA-C  Rimegepant Sulfate (NURTEC) 75 MG TBDP Take 75 mg by mouth daily as needed (Maximum 1 tablet in 24 hours). 04/10/20   Drema Dallas, DO  traMADol (ULTRAM) 50 MG tablet Take 50 mg by mouth every 6 (six) hours. 06/13/19   [provider]      Allergies    Poultry meal, Sesame oil, and Egg-derived products    Review of Systems   Review of Systems  HENT:  Positive for dental problem.   Neurological:  Positive for numbness.   All other systems reviewed and are negative.   Physical Exam Updated Vital Signs BP (!) 130/94 (BP Location: Right Arm)   Pulse 70   Temp 98.8 F (37.1 C) (Oral)   Resp 17   Ht 5' (1.524 m)   Wt 46.3 kg   SpO2 100%   BMI 19.92 kg/m  Physical Exam Vitals and nursing note reviewed.  Constitutional:      General: She is not in acute distress.    Appearance: She is well-developed.  HENT:     Head: Normocephalic and atraumatic.  Eyes:     Conjunctiva/sclera: Conjunctivae normal.  Cardiovascular:     Rate and Rhythm: Normal rate and regular rhythm.     Heart sounds: No murmur heard. Pulmonary:     Effort: Pulmonary effort is normal. No respiratory distress.     Breath sounds: Normal breath sounds.  Abdominal:     Palpations: Abdomen is soft.     Tenderness: There is no abdominal tenderness.  Musculoskeletal:        General: No swelling.     Cervical back: Neck supple.  Skin:    General: Skin is warm and dry.     Capillary Refill: Capillary refill takes less than 2 seconds.  Neurological:     General: No focal deficit present.     Mental Status: She is alert. Mental status is at baseline.  Cranial Nerves: No cranial nerve deficit.     Comments: CN II-XII intact  Psychiatric:        Mood and Affect: Mood normal.     ED Results / Procedures / Treatments   Labs (all labs ordered are listed, but only abnormal results are displayed) Labs Reviewed  COMPREHENSIVE METABOLIC PANEL - Abnormal; Notable for the following components:      Result Value   Potassium 3.2 (*)    Glucose, Bld 112 (*)    Calcium 8.7 (*)    All other components within normal limits  CBC WITH DIFFERENTIAL/PLATELET - Abnormal; Notable for the following components:   RDW 16.1 (*)    All other components within normal limits  HCG, QUANTITATIVE, PREGNANCY    EKG None  Radiology CT Head Wo Contrast  Result Date: 11/16/2022 CLINICAL DATA:  Headache and dental infection EXAM: CT HEAD WITHOUT  CONTRAST CT MAXILLOFACIAL WITHOUT CONTRAST TECHNIQUE: Multidetector CT imaging of the head and maxillofacial structures were performed using the standard protocol without intravenous contrast. Multiplanar CT image reconstructions of the maxillofacial structures were also generated. RADIATION DOSE REDUCTION: This exam was performed according to the departmental dose-optimization program which includes automated exposure control, adjustment of the mA and/or kV according to patient size and/or use of iterative reconstruction technique. COMPARISON:  None Available. FINDINGS: CT HEAD FINDINGS Brain: There is no mass, hemorrhage or extra-axial collection. The size and configuration of the ventricles and extra-axial CSF spaces are normal. The brain parenchyma is normal, without evidence of acute or chronic infarction. Vascular: No hyperdense vessel or unexpected vascular calcification. Skull: The visualized skull base, calvarium and extracranial soft tissues are normal. CT MAXILLOFACIAL FINDINGS Osseous: No facial fracture or mandibular dislocation. Large periapical lucency of tooth 32 common the most posterior right mandibular molar. Orbits: The globes and optic nerves are intact. Normal extraocular muscles and intraorbital fat. Sinuses: No acute finding. Soft tissues: Normal visualized extracranial soft tissues. IMPRESSION: 1. No acute intracranial abnormality. 2. Large periapical lucency of tooth 32.  No subperiosteal abscess. Electronically Signed   By: Deatra Robinson M.D.   On: 11/16/2022 21:28   CT Maxillofacial WO CM  Result Date: 11/16/2022 CLINICAL DATA:  Headache and dental infection EXAM: CT HEAD WITHOUT CONTRAST CT MAXILLOFACIAL WITHOUT CONTRAST TECHNIQUE: Multidetector CT imaging of the head and maxillofacial structures were performed using the standard protocol without intravenous contrast. Multiplanar CT image reconstructions of the maxillofacial structures were also generated. RADIATION DOSE REDUCTION:  This exam was performed according to the departmental dose-optimization program which includes automated exposure control, adjustment of the mA and/or kV according to patient size and/or use of iterative reconstruction technique. COMPARISON:  None Available. FINDINGS: CT HEAD FINDINGS Brain: There is no mass, hemorrhage or extra-axial collection. The size and configuration of the ventricles and extra-axial CSF spaces are normal. The brain parenchyma is normal, without evidence of acute or chronic infarction. Vascular: No hyperdense vessel or unexpected vascular calcification. Skull: The visualized skull base, calvarium and extracranial soft tissues are normal. CT MAXILLOFACIAL FINDINGS Osseous: No facial fracture or mandibular dislocation. Large periapical lucency of tooth 32 common the most posterior right mandibular molar. Orbits: The globes and optic nerves are intact. Normal extraocular muscles and intraorbital fat. Sinuses: No acute finding. Soft tissues: Normal visualized extracranial soft tissues. IMPRESSION: 1. No acute intracranial abnormality. 2. Large periapical lucency of tooth 32.  No subperiosteal abscess. Electronically Signed   By: Deatra Robinson M.D.   On: 11/16/2022 21:28  Procedures Procedures   Medications Ordered in ED Medications  oxyCODONE-acetaminophen (PERCOCET/ROXICET) 5-325 MG per tablet 1 tablet (1 tablet Oral Given 11/16/22 2039)  HYDROcodone-acetaminophen (NORCO/VICODIN) 5-325 MG per tablet 2 tablet (2 tablets Oral Given 11/16/22 2236)    ED Course/ Medical Decision Making/ A&P                           Medical Decision Making Amount and/or Complexity of Data Reviewed Labs: ordered. Radiology: ordered.  Risk Prescription drug management.   This patient presents to the ED for concern of dental problem.  Differential diagnosis includes dental caries, dental abscess, gingivitis, peritonsillar abscess   Lab Tests:  I Ordered, and personally interpreted labs.  The  pertinent results include: CBC unremarkable, CMP only notable for mild hypokalemia at 3.2, beta-hCG negative   Imaging Studies ordered:  I ordered imaging studies including CT head, CT maxillofacial I independently visualized and interpreted imaging which showed no acute intracranial abnormality, no dental abscess but large periapical lucency noted to 32 I agree with the radiologist interpretation   Medicines ordered and prescription drug management:  I ordered medication including Percocet, Norco  for pain  Reevaluation of the patient after these medicines showed that the patient improved I have reviewed the patients home medicines and have made adjustments as needed   Problem List / ED Course:  Patient presented to the ED with complaints of dental pain. She also reported a brief episode of right sided numbness that was still present on face. She denied any weakness or numbness anywhere. Was asked to evaluate this patient in triage and do not believe she met code stroke as NIH score of 1 for facial numbness, but I believe this is more likely to be neural irritation from the dental pain patient is experiencing. CT head ordered in conjunction with CT maxillofacial. CT imaging reassuring with no evidence of any intracranial abnormalities. CT maxillofacial with signs of periapical lucency likely indicating dental caries at the area causing patient notable pain. Pain improvement with Percocet. Advised patient of findings and encouraged patient to continue Amoxicillin as prescribed by dentist, but also sent in prescription for Norco for added pain relief at home until patient is able to be seen by dentist next week. Patient is agreeable with this treatment plan and verbalized understanding all return precautions.  Final Clinical Impression(s) / ED Diagnoses Final diagnoses:  Pain due to dental caries    Rx / DC Orders ED Discharge Orders          Ordered    HYDROcodone-acetaminophen  (NORCO/VICODIN) 5-325 MG tablet  Every 4 hours PRN        11/16/22 2257              Smitty Knudsen, PA-C 11/16/22 2310    Sloan Leiter, DO 11/18/22 1539

## 2022-11-16 NOTE — ED Triage Notes (Signed)
PT went to dentist this AM and reports infection to back of RT lower mouth in tooth. Pt given abx and was told she cannot have tooth extracted. Pt states she was having dinner and lost feeling to the RT side of body. Now she has numbness to RT face. Pt reports HTN and wearing BP cuff on LT wrist.

## 2022-11-16 NOTE — ED Notes (Signed)
Pt given warm compress to apply to her right cheek

## 2022-11-16 NOTE — ED Notes (Signed)
Pt not a code stroke; will have imaging done as a precaution.

## 2022-11-16 NOTE — Discharge Instructions (Addendum)
You are seen in the emergency department for dental pain.  Based on your CT imaging of your jaw, there appears to be a notable dental caries on the right rear molar.  I advise that you continue to take your amoxicillin that you are prescribed by your dentist and follow-up with them for further treatment in the next week or so.  I will send a prescription for Norco that he may take for more severe pain if needed.  Otherwise he can take ibuprofen or Tylenol for more moderate pain.  Return to the emergency department if other symptoms are worsening your pain is unmanageable.

## 2022-11-16 NOTE — ED Notes (Signed)
PA Zelaya with pt now for stroke assessment due to pt c/o acute numbness to RT side of body and now RT side of face.

## 2024-04-17 ENCOUNTER — Encounter (HOSPITAL_BASED_OUTPATIENT_CLINIC_OR_DEPARTMENT_OTHER): Payer: Self-pay

## 2024-04-17 DIAGNOSIS — H538 Other visual disturbances: Secondary | ICD-10-CM | POA: Diagnosis not present

## 2024-04-17 DIAGNOSIS — R531 Weakness: Secondary | ICD-10-CM | POA: Insufficient documentation

## 2024-04-17 DIAGNOSIS — R42 Dizziness and giddiness: Secondary | ICD-10-CM | POA: Diagnosis present

## 2024-04-17 DIAGNOSIS — R110 Nausea: Secondary | ICD-10-CM | POA: Diagnosis not present

## 2024-04-17 LAB — CBC
HCT: 35.9 % — ABNORMAL LOW (ref 36.0–46.0)
Hemoglobin: 11.8 g/dL — ABNORMAL LOW (ref 12.0–15.0)
MCH: 27.9 pg (ref 26.0–34.0)
MCHC: 32.9 g/dL (ref 30.0–36.0)
MCV: 84.9 fL (ref 80.0–100.0)
Platelets: 244 10*3/uL (ref 150–400)
RBC: 4.23 MIL/uL (ref 3.87–5.11)
RDW: 13.9 % (ref 11.5–15.5)
WBC: 8 10*3/uL (ref 4.0–10.5)
nRBC: 0 % (ref 0.0–0.2)

## 2024-04-17 NOTE — ED Triage Notes (Signed)
 Pt is coming in for weakness/fainting spells has been going on for two days, she also mentions that she has some blurry vision when she is having these spells of feeling fainting. She endorses having nausea and vomiting but that was only yesterday. No fevers, no cough or congestion. She also mentions some shortness of breath but no chest pain.

## 2024-04-18 ENCOUNTER — Emergency Department (HOSPITAL_BASED_OUTPATIENT_CLINIC_OR_DEPARTMENT_OTHER)
Admission: EM | Admit: 2024-04-18 | Discharge: 2024-04-18 | Disposition: A | Discharge status: Home / Self Care | Attending: Emergency Medicine | Admitting: Emergency Medicine

## 2024-04-18 DIAGNOSIS — R42 Dizziness and giddiness: Secondary | ICD-10-CM | POA: Diagnosis not present

## 2024-04-18 LAB — COMPREHENSIVE METABOLIC PANEL WITH GFR
ALT: 7 U/L (ref 0–44)
AST: 18 U/L (ref 15–41)
Albumin: 3.9 g/dL (ref 3.5–5.0)
Alkaline Phosphatase: 62 U/L (ref 38–126)
Anion gap: 8 (ref 5–15)
BUN: 11 mg/dL (ref 6–20)
CO2: 25 mmol/L (ref 22–32)
Calcium: 9.3 mg/dL (ref 8.9–10.3)
Chloride: 106 mmol/L (ref 98–111)
Creatinine, Ser: 0.89 mg/dL (ref 0.44–1.00)
GFR, Estimated: >60 mL/min (ref >60)
Glucose, Bld: 100 mg/dL — ABNORMAL HIGH (ref 70–99)
Potassium: 4 mmol/L (ref 3.5–5.1)
Sodium: 140 mmol/L (ref 135–145)
Total Bilirubin: 0.3 mg/dL (ref 0.0–1.2)
Total Protein: 6.2 g/dL — ABNORMAL LOW (ref 6.5–8.1)

## 2024-04-18 LAB — URINALYSIS, ROUTINE W REFLEX MICROSCOPIC
Bilirubin Urine: NEGATIVE
Glucose, UA: NEGATIVE mg/dL
Hgb urine dipstick: NEGATIVE
Leukocytes,Ua: NEGATIVE
Nitrite: NEGATIVE
Specific Gravity, Urine: 1.038 — ABNORMAL HIGH (ref 1.005–1.030)
pH: 6 (ref 5.0–8.0)

## 2024-04-18 LAB — PREGNANCY, URINE: Preg Test, Ur: NEGATIVE

## 2024-04-18 MED ORDER — MECLIZINE HCL 25 MG PO TABS: 25.0000 mg | ORAL_TABLET | Freq: Three times a day (TID) | ORAL | 0 refills | Status: AC | PRN

## 2024-04-18 MED ORDER — PROCHLORPERAZINE EDISYLATE 10 MG/2ML IJ SOLN
5.0000 mg | Freq: Once | INTRAMUSCULAR | Status: AC
  Administered 2024-04-18: 5 mg via INTRAVENOUS
  Filled 2024-04-18: qty 2

## 2024-04-18 MED ORDER — MECLIZINE HCL 25 MG PO TABS
25.0000 mg | ORAL_TABLET | Freq: Once | ORAL | Status: AC
  Administered 2024-04-18: 25 mg via ORAL
  Filled 2024-04-18: qty 1

## 2024-04-18 MED ORDER — SODIUM CHLORIDE 0.9 % IV BOLUS
1000.0000 mL | Freq: Once | INTRAVENOUS | Status: AC
  Administered 2024-04-18: 1000 mL via INTRAVENOUS

## 2024-04-18 MED ORDER — ONDANSETRON 4 MG PO TBDP: 4.0000 mg | ORAL_TABLET | Freq: Three times a day (TID) | ORAL | 0 refills | Status: AC | PRN

## 2024-04-18 NOTE — ED Provider Notes (Signed)
 [place] EMERGENCY DEPARTMENT AT [facility] Provider Note  CSN: [id number] Arrival date & time: 04/17/24 2314  Chief Complaint(s) Weakness  HPI Tara Willis is a 43 y.o. female with a past medical history listed below who presents to the emergency department with episodes of lightheadedness with standing and moving.  Symptoms began Monday but became worst Tuesday morning.  She has had nausea with a few bouts of nonbloody nonbilious emesis.  Denies any recent fevers or infections.  No cough or congestion.  She reports intermittent blurry vision when she closes her eyes for prolonged period of time but quickly clears up.  No chest pain or shortness of breath.  No focal weakness.  No headache.  Patient does report a history of vertigo but not this severe.  HPI  Past Medical History Past Medical History:  Diagnosis Date   Postpartum care following vaginal delivery (3/20) 10/21/2015   S/P bilateral tubal ligation (3/21) 10/21/2015   Patient Active Problem List   Diagnosis Date Noted   Insomnia 10/27/2018   Low back strain 02/17/2018   Prepatellar bursitis of right knee 02/17/2018   Allergy to food 08/31/2017   Postpartum care following vaginal delivery (3/20) 10/21/2015   S/P bilateral tubal ligation (3/21) 10/21/2015   Normal labor and delivery 10/20/2015   Request for sterilization 10/20/2015   Home Medication(s) Prior to Admission medications   Medication Sig Start Date End Date Taking? Authorizing Provider  meclizine  (ANTIVERT ) 25 MG tablet Take 1 tablet (25 mg total) by mouth 3 (three) times daily as needed for dizziness. 04/18/24  Yes Alaia Lordi, Raynell Moder, MD  ondansetron  (ZOFRAN -ODT) 4 MG disintegrating tablet Take 1 tablet (4 mg total) by mouth every 8 (eight) hours as needed for up to 3 days for nausea or vomiting. 04/18/24 04/21/24 Yes Bradin Mcadory, Raynell Moder, MD  Galcanezumab -gnlm (EMGALITY ) 120 MG/ML SOAJ Inject 120 mg into the skin every 28 (twenty-eight) days.  08/29/19   Skeet Juliene SAUNDERS, DO  HYDROcodone -acetaminophen  (NORCO/VICODIN) 5-325 MG tablet Take 2 tablets by mouth every 4 (four) hours as needed. 11/16/22   Zelaya, Oscar A, PA-C  ibuprofen  (ADVIL ,MOTRIN ) 600 MG tablet Take 1 tablet (600 mg total) by mouth every 6 (six) hours as needed. 02/06/18   Jason Fish B, PA-C  Rimegepant Sulfate (NURTEC) 75 MG TBDP Take 75 mg by mouth daily as needed (Maximum 1 tablet in 24 hours). 04/10/20   Skeet Juliene SAUNDERS, DO  traMADol (ULTRAM) 50 MG tablet Take 50 mg by mouth every 6 (six) hours. 06/13/19   [provider]                                                                                                                                    Allergies Poultry meal, Sesame oil, and Egg-derived products  Review of Systems Review of Systems As noted in HPI  Physical Exam Vital Signs  I have reviewed the triage vital signs  BP 120/87   Pulse 69   Temp 98.7 F (37.1 C)   Resp 17   SpO2 100%   Physical Exam Vitals reviewed.  Constitutional:      General: She is not in acute distress.    Appearance: She is well-developed. She is not diaphoretic.  HENT:     Head: Normocephalic and atraumatic.     Right Ear: External ear normal.     Left Ear: External ear normal.     Nose: Nose normal.  Eyes:     General: No scleral icterus.    Conjunctiva/sclera: Conjunctivae normal.  Neck:     Trachea: Phonation normal.  Cardiovascular:     Rate and Rhythm: Normal rate and regular rhythm.  Pulmonary:     Effort: Pulmonary effort is normal. No respiratory distress.     Breath sounds: No stridor.  Abdominal:     General: There is no distension.  Musculoskeletal:        General: Normal range of motion.     Cervical back: Normal range of motion.  Neurological:     Mental Status: She is alert and oriented to person, place, and time.     Comments: Mental Status:  Alert and oriented to person, place, and time.  Attention and concentration normal.  Speech  clear.  Recent memory is intact  Cranial Nerves:  II Visual Fields: Intact to confrontation. Visual fields intact. III, IV, VI: Pupils equal and reactive to light and near. Full eye movement without nystagmus  V Facial Sensation: Normal. No weakness of masticatory muscles  VII: No facial weakness or asymmetry  VIII Auditory Acuity: Grossly normal  IX/X: The uvula is midline; the palate elevates symmetrically  XI: Normal sternocleidomastoid and trapezius strength  XII: The tongue is midline. No atrophy or fasciculations.   Motor System: Muscle Strength: 5/5 and symmetric in the upper and lower extremities. No pronation or drift.  Muscle Tone: Tone and muscle bulk are normal in the upper and lower extremities.  Coordination: Intact finger-to-nose No tremor.  Sensation: Intact to light touch Gait: Routine  gait normal.  HINTS Plus: Nystagmus: Unidirectional and lateral with fast beat correction to the right  Head impulse: Abnormal Skew: Normal Hearing: Intact   Psychiatric:        Behavior: Behavior normal.     ED Results and Treatments Labs (all labs ordered are listed, but only abnormal results are displayed) Labs Reviewed  COMPREHENSIVE METABOLIC PANEL WITH GFR - Abnormal; Notable for the following components:      Result Value   Glucose, Bld 100 (*)    Total Protein 6.2 (*)    All other components within normal limits  CBC - Abnormal; Notable for the following components:   Hemoglobin 11.8 (*)    HCT 35.9 (*)    All other components within normal limits  URINALYSIS, ROUTINE W REFLEX MICROSCOPIC - Abnormal; Notable for the following components:   Specific Gravity, Urine 1.038 (*)    Ketones, ur TRACE (*)    Protein, ur TRACE (*)    All other components within normal limits  PREGNANCY, URINE  EKG  EKG Interpretation Date/Time:  Tuesday April 17 2024 23:27:51 EDT Ventricular Rate:  71 PR Interval:  197 QRS Duration:  85 QT Interval:  368 QTC Calculation: 400 R Axis:   68  Text Interpretation: Sinus rhythm No significant change was found Confirmed by Trine Likes [phone]) on 04/18/2024 2:21:23 AM       Radiology No results found.  Medications Ordered in ED Medications  prochlorperazine  (COMPAZINE ) injection 5 mg (5 mg Intravenous Given 04/18/24 0310)  meclizine  (ANTIVERT ) tablet 25 mg (25 mg Oral Given 04/18/24 0310)  sodium chloride  0.9 % bolus 1,000 mL (0 mLs Intravenous Stopped 04/18/24 0353)   Procedures Procedures  (including critical care time) Medical Decision Making / ED Course   Medical Decision Making Amount and/or Complexity of Data Reviewed Labs: ordered. Decision-making details documented in ED Course. ECG/medicine tests: ordered and independent interpretation performed. Decision-making details documented in ED Course.  Risk Prescription drug management.    Faintness and dizziness differential diagnosis considered Presentation and exam is most suspicious for peripheral vertigo. EKG without acute ischemic changes, dysrhythmias or blocks. CBC without leukocytosis.  Mild anemia with stable hemoglobin.  No significant electrolyte derangements or renal sufficiency.  No evidence of bili obstruction or pancreatitis.  UPT negative ruling out pregnancy related process.  UA without evidence of infection. Doubt central causes requiring CT head.  Patient provided with IV fluids, antiemetics and Antivert .  Patient reports significant relief.  Able to ambulate well.    Final Clinical Impression(s) / ED Diagnoses Final diagnoses:  Dizziness    This chart was dictated using voice recognition software.  Despite best efforts to proofread,  errors can occur which can change the documentation meaning.    Trine Likes Moder, MD 04/18/24 [phone]
